# Patient Record
Sex: Female | Born: 2000
Health system: Southern US, Community
[De-identification: ages and names within clinical notes are randomized; demographics above are authoritative.]

## PROBLEM LIST (undated history)

## (undated) DIAGNOSIS — F259 Schizoaffective disorder, unspecified: Secondary | ICD-10-CM

## (undated) DIAGNOSIS — F329 Major depressive disorder, single episode, unspecified: Secondary | ICD-10-CM

## (undated) DIAGNOSIS — J45909 Unspecified asthma, uncomplicated: Secondary | ICD-10-CM

## (undated) DIAGNOSIS — F32A Depression, unspecified: Secondary | ICD-10-CM

## (undated) DIAGNOSIS — F419 Anxiety disorder, unspecified: Secondary | ICD-10-CM

## (undated) DIAGNOSIS — F909 Attention-deficit hyperactivity disorder, unspecified type: Secondary | ICD-10-CM

## (undated) DIAGNOSIS — F840 Autistic disorder: Secondary | ICD-10-CM

## (undated) HISTORY — DX: Attention-deficit hyperactivity disorder, unspecified type: F90.9

## (undated) HISTORY — DX: Schizoaffective disorder, unspecified: F25.9

---
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
## 2018-11-07 ENCOUNTER — Emergency Department (HOSPITAL_COMMUNITY)
Admission: EM | Admit: 2018-11-07 | Discharge: 2018-11-07 | Disposition: A | Discharge status: Home / Self Care | Payer: Medicare Other | Attending: Emergency Medicine | Admitting: Emergency Medicine

## 2018-11-07 ENCOUNTER — Other Ambulatory Visit: Payer: Self-pay

## 2018-11-07 ENCOUNTER — Encounter (HOSPITAL_COMMUNITY): Payer: Self-pay | Admitting: Emergency Medicine

## 2018-11-07 ENCOUNTER — Emergency Department (HOSPITAL_COMMUNITY): Payer: Medicare Other

## 2018-11-07 DIAGNOSIS — J45909 Unspecified asthma, uncomplicated: Secondary | ICD-10-CM | POA: Diagnosis not present

## 2018-11-07 DIAGNOSIS — M25562 Pain in left knee: Secondary | ICD-10-CM | POA: Diagnosis not present

## 2018-11-07 DIAGNOSIS — F840 Autistic disorder: Secondary | ICD-10-CM | POA: Diagnosis not present

## 2018-11-07 DIAGNOSIS — Z79899 Other long term (current) drug therapy: Secondary | ICD-10-CM | POA: Diagnosis not present

## 2018-11-07 DIAGNOSIS — Z888 Allergy status to other drugs, medicaments and biological substances status: Secondary | ICD-10-CM | POA: Diagnosis not present

## 2018-11-07 DIAGNOSIS — Z91040 Latex allergy status: Secondary | ICD-10-CM | POA: Diagnosis not present

## 2018-11-07 DIAGNOSIS — M25561 Pain in right knee: Secondary | ICD-10-CM | POA: Diagnosis not present

## 2018-11-07 MED ORDER — ACETAMINOPHEN 500 MG PO TABS
1000.0000 mg | ORAL_TABLET | Freq: Once | ORAL | Status: AC
  Administered 2018-11-07: 1000 mg via ORAL
  Filled 2018-11-07: qty 2
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
## 2019-01-03 ENCOUNTER — Other Ambulatory Visit: Payer: Self-pay

## 2019-01-03 DIAGNOSIS — Z20822 Contact with and (suspected) exposure to covid-19: Secondary | ICD-10-CM

## 2019-01-06 LAB — NOVEL CORONAVIRUS, NAA: SARS-CoV-2, NAA: NOT DETECTED

## 2019-04-30 ENCOUNTER — Ambulatory Visit: Payer: Medicare Other | Attending: Family

## 2019-04-30 DIAGNOSIS — Z23 Encounter for immunization: Secondary | ICD-10-CM

## 2019-04-30 NOTE — Progress Notes (Signed)
   Covid-19 Vaccination Clinic  Name:  Jessica Vargas    MRN: [id number] DOB: 08-Jul-2000  04/30/2019  Jessica Vargas was observed post Covid-19 immunization for 15 minutes without incident. She was provided with Vaccine Information Sheet and instruction to access the V-Safe system.   Jessica Vargas was instructed to call 911 with any severe reactions post vaccine: Marland Kitchen Difficulty breathing  . Swelling of face and throat  . A fast heartbeat  . A bad rash all over body  . Dizziness and weakness   Immunizations Administered    Name Date Dose VIS Date Route   Moderna COVID-19 Vaccine 04/30/2019  1:35 PM 0.5 mL 12/30/2018 Intramuscular   Manufacturer: Moderna   Lot: [id number]   NDC: 02669-167-56

## 2019-06-02 ENCOUNTER — Ambulatory Visit: Payer: Medicare Other | Attending: Family

## 2019-06-02 DIAGNOSIS — Z23 Encounter for immunization: Secondary | ICD-10-CM

## 2019-06-02 NOTE — Progress Notes (Signed)
   Covid-19 Vaccination Clinic  Name:  Jessica Vargas    MRN: [id number] DOB: July 16, 2000  06/02/2019  [name] was observed post Covid-19 immunization for 15 minutes without incident. She was provided with Vaccine Information Sheet and instruction to access the V-Safe system.   [name] was instructed to call 911 with any severe reactions post vaccine: Marland Kitchen Difficulty breathing  . Swelling of face and throat  . A fast heartbeat  . A bad rash all over body  . Dizziness and weakness   Immunizations Administered    Name Date Dose VIS Date Route   Moderna COVID-19 Vaccine 06/02/2019  2:01 PM 0.5 mL 12/2018 Intramuscular   Manufacturer: Moderna   Lot: [id number]   NDC: 56720-919-80
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
## 2020-01-06 ENCOUNTER — Ambulatory Visit (INDEPENDENT_AMBULATORY_CARE_PROVIDER_SITE_OTHER): Payer: Medicare Other | Admitting: Obstetrics

## 2020-01-06 ENCOUNTER — Other Ambulatory Visit (HOSPITAL_COMMUNITY)
Admission: RE | Admit: 2020-01-06 | Discharge: 2020-01-06 | Disposition: A | Discharge status: Home / Self Care | Payer: Medicare Other | Source: Ambulatory Visit | Attending: Obstetrics | Admitting: Obstetrics

## 2020-01-06 ENCOUNTER — Encounter: Payer: Self-pay | Admitting: Obstetrics

## 2020-01-06 ENCOUNTER — Other Ambulatory Visit: Payer: Self-pay

## 2020-01-06 VITALS — BP 110/69 | HR 79 | Ht 66.0 in | Wt 185.0 lb

## 2020-01-06 DIAGNOSIS — N898 Other specified noninflammatory disorders of vagina: Secondary | ICD-10-CM

## 2020-01-06 DIAGNOSIS — N939 Abnormal uterine and vaginal bleeding, unspecified: Secondary | ICD-10-CM

## 2020-01-06 DIAGNOSIS — R102 Pelvic and perineal pain: Secondary | ICD-10-CM

## 2020-01-06 DIAGNOSIS — Z01419 Encounter for gynecological examination (general) (routine) without abnormal findings: Secondary | ICD-10-CM | POA: Diagnosis not present

## 2020-01-06 DIAGNOSIS — Z30431 Encounter for routine checking of intrauterine contraceptive device: Secondary | ICD-10-CM

## 2020-01-06 DIAGNOSIS — N946 Dysmenorrhea, unspecified: Secondary | ICD-10-CM

## 2020-01-06 MED ORDER — IBUPROFEN 800 MG PO TABS: 800.0000 mg | ORAL_TABLET | Freq: Three times a day (TID) | ORAL | 5 refills | Status: DC | PRN

## 2020-01-06 NOTE — Progress Notes (Signed)
Pt states she is having heavy, frequent periods, bleeding with intercourse, hot flashes and pelvic pain.  Pt has IUD-Mirena in place x 4 years.
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
## 2020-01-07 ENCOUNTER — Other Ambulatory Visit: Payer: Self-pay | Admitting: Obstetrics

## 2020-01-07 DIAGNOSIS — A749 Chlamydial infection, unspecified: Secondary | ICD-10-CM

## 2020-01-07 DIAGNOSIS — N730 Acute parametritis and pelvic cellulitis: Secondary | ICD-10-CM

## 2020-01-07 DIAGNOSIS — B9689 Other specified bacterial agents as the cause of diseases classified elsewhere: Secondary | ICD-10-CM

## 2020-01-07 LAB — CERVICOVAGINAL ANCILLARY ONLY
Bacterial Vaginitis (gardnerella): POSITIVE — AB
Candida Glabrata: NEGATIVE
Candida Vaginitis: NEGATIVE
Chlamydia: POSITIVE — AB
Comment: NEGATIVE
Comment: NEGATIVE
Comment: NEGATIVE
Comment: NEGATIVE
Comment: NEGATIVE
Comment: NORMAL
Neisseria Gonorrhea: NEGATIVE
Trichomonas: NEGATIVE

## 2020-01-07 MED ORDER — DOXYCYCLINE HYCLATE 100 MG PO CAPS: 100.0000 mg | ORAL_CAPSULE | Freq: Two times a day (BID) | ORAL | 0 refills | Status: DC

## 2020-01-07 MED ORDER — METRONIDAZOLE 500 MG PO TABS: 500.0000 mg | ORAL_TABLET | Freq: Two times a day (BID) | ORAL | 0 refills | Status: DC

## 2020-01-07 NOTE — Addendum Note (Signed)
Addended by: Coral Ceo A on: 01/07/2020 01:40 PM   Modules accepted: Orders

## 2020-01-08 LAB — CYTOLOGY - PAP
Diagnosis: NEGATIVE
Diagnosis: REACTIVE

## 2020-01-20 ENCOUNTER — Inpatient Hospital Stay: Admission: RE | Admit: 2020-01-20 | Payer: Medicare Other | Source: Ambulatory Visit

## 2020-01-21 ENCOUNTER — Ambulatory Visit: Payer: Medicare Other | Admitting: Obstetrics

## 2020-01-26 ENCOUNTER — Ambulatory Visit: Payer: Medicare Other

## 2020-01-28 ENCOUNTER — Other Ambulatory Visit: Payer: Self-pay

## 2020-01-28 ENCOUNTER — Ambulatory Visit (HOSPITAL_COMMUNITY)
Admission: RE | Admit: 2020-01-28 | Discharge: 2020-01-28 | Disposition: A | Discharge status: Home / Self Care | Payer: Medicare Other | Source: Ambulatory Visit | Attending: Obstetrics | Admitting: Obstetrics

## 2020-01-28 DIAGNOSIS — R102 Pelvic and perineal pain: Secondary | ICD-10-CM | POA: Diagnosis not present

## 2020-02-01 ENCOUNTER — Ambulatory Visit: Payer: Medicare Other | Admitting: Obstetrics

## 2020-02-04 ENCOUNTER — Other Ambulatory Visit: Payer: Self-pay

## 2020-02-04 ENCOUNTER — Ambulatory Visit (INDEPENDENT_AMBULATORY_CARE_PROVIDER_SITE_OTHER): Payer: Medicare Other | Admitting: Obstetrics

## 2020-02-04 ENCOUNTER — Encounter: Payer: Self-pay | Admitting: Obstetrics

## 2020-02-04 ENCOUNTER — Other Ambulatory Visit (HOSPITAL_COMMUNITY)
Admission: RE | Admit: 2020-02-04 | Discharge: 2020-02-04 | Disposition: A | Discharge status: Home / Self Care | Payer: Medicare Other | Source: Ambulatory Visit | Attending: Obstetrics | Admitting: Obstetrics

## 2020-02-04 VITALS — BP 126/76 | HR 93 | Wt 178.0 lb

## 2020-02-04 DIAGNOSIS — Z0000 Encounter for general adult medical examination without abnormal findings: Secondary | ICD-10-CM | POA: Diagnosis not present

## 2020-02-04 DIAGNOSIS — B373 Candidiasis of vulva and vagina: Secondary | ICD-10-CM | POA: Diagnosis not present

## 2020-02-04 DIAGNOSIS — A749 Chlamydial infection, unspecified: Secondary | ICD-10-CM | POA: Insufficient documentation

## 2020-02-04 DIAGNOSIS — Z23 Encounter for immunization: Secondary | ICD-10-CM

## 2020-02-04 DIAGNOSIS — B3731 Acute candidiasis of vulva and vagina: Secondary | ICD-10-CM

## 2020-02-04 MED ORDER — FLUCONAZOLE 150 MG PO TABS: 150.0000 mg | ORAL_TABLET | Freq: Once | ORAL | 0 refills | Status: DC

## 2020-02-04 NOTE — Progress Notes (Signed)
Pt presents for TOC CT Pt completed ABx Partner has meds but she's unsure if he took it

## 2020-02-04 NOTE — Progress Notes (Signed)
Patient ID: Jessica Vargas, female   DOB: 2000-12-25, 20 y.o.   MRN: [id number]  Chief Complaint  Patient presents with  . Exposure to STD    HPI Jessica Vargas is a 20 y.o. female.  History of chlamydia infection, treated.  Presents today fo TOC.  Complains of vaginal irritation. HPI  Past Medical History:  Diagnosis Date  . ADHD   . Anxiety   . Asthma   . Autism   . Depression   . Schizoaffective disorder (HCC)     History reviewed. No pertinent surgical history.  History reviewed. No pertinent family history.  Social History Social History   Tobacco Use  . Smoking status: Never Smoker  . Smokeless tobacco: Never Used  Substance Use Topics  . Alcohol use: Yes    Alcohol/week: 0.0 standard drinks    Comment: occ  . Drug use: No    Allergies  Allergen Reactions  . Latex Itching  . Risperidone Other (See Comments)    Neurologic    Current Outpatient Medications  Medication Sig Dispense Refill  . albuterol (PROVENTIL HFA;VENTOLIN HFA) 108 (90 Base) MCG/ACT inhaler Inhale 2 puffs into the lungs every 4 (four) hours as needed for wheezing or shortness of breath. 1 Inhaler 0  . fluconazole (DIFLUCAN) 150 MG tablet Take 1 tablet (150 mg total) by mouth once for 1 dose. 1 tablet 0  . ibuprofen (ADVIL) 800 MG tablet Take 1 tablet (800 mg total) by mouth every 8 (eight) hours as needed. 30 tablet 5  . doxycycline (VIBRAMYCIN) 100 MG capsule Take 1 capsule (100 mg total) by mouth 2 (two) times daily. (Patient not taking: Reported on 02/04/2020) 28 capsule 0  . metroNIDAZOLE (FLAGYL) 500 MG tablet Take 1 tablet (500 mg total) by mouth 2 (two) times daily. (Patient not taking: Reported on 02/04/2020) 28 tablet 0  . naproxen (NAPROSYN) 500 MG tablet Take 1 tablet (500 mg total) by mouth 2 (two) times daily. (Patient not taking: Reported on 02/04/2020) 30 tablet 0   No current facility-administered medications for this visit.    Review of Systems Review of Systems Constitutional:  negative for fatigue and weight loss Respiratory: negative for cough and wheezing Cardiovascular: negative for chest pain, fatigue and palpitations Gastrointestinal: negative for abdominal pain and change in bowel habits Genitourinary:positive for vaginal discharge and irritation Integument/breast: negative for nipple discharge Musculoskeletal:negative for myalgias Neurological: negative for gait problems and tremors Behavioral/Psych: negative for abusive relationship, depression Endocrine: negative for temperature intolerance      Blood pressure 126/76, pulse 93, weight 178 lb (80.7 kg), last menstrual period 01/16/2020.  Physical Exam Physical Exam           General:  Alert and no distress Abdomen:  normal findings: no organomegaly, soft, non-tender and no hernia  Pelvis:  External genitalia: normal general appearance Urinary system: urethral meatus normal and bladder without fullness, nontender Vaginal: normal without tenderness, induration or masses Cervix: normal appearance Adnexa: normal bimanual exam Uterus: anteverted and non-tender, normal size    50% of 15 min visit spent on counseling and coordination of care.   Data Reviewed Cultures Wet Prep  Assessment     1. Chlamydia infection Rx: - Cervicovaginal ancillary only  2. Candida vaginitis Rx: - fluconazole (DIFLUCAN) 150 MG tablet; Take 1 tablet (150 mg total) by mouth once for 1 dose.  Dispense: 1 tablet; Refill: 0  3. Routine adult health maintenance Rx: - Flu Vaccine QUAD 36+ mos IM (Fluarix, Quad PF)  Plan   Follow up prn  Orders Placed This Encounter  Procedures  . Flu Vaccine QUAD 36+ mos IM (Fluarix, Quad PF)    Meds ordered this encounter  Medications  . fluconazole (DIFLUCAN) 150 MG tablet    Sig: Take 1 tablet (150 mg total) by mouth once for 1 dose.    Dispense:  1 tablet    Refill:  0     Brock Bad, MD 02/04/2020 11:26 AM

## 2020-02-05 ENCOUNTER — Other Ambulatory Visit: Payer: Self-pay | Admitting: Obstetrics

## 2020-02-05 DIAGNOSIS — B373 Candidiasis of vulva and vagina: Secondary | ICD-10-CM

## 2020-02-05 DIAGNOSIS — B3731 Acute candidiasis of vulva and vagina: Secondary | ICD-10-CM

## 2020-02-05 LAB — CERVICOVAGINAL ANCILLARY ONLY
Bacterial Vaginitis (gardnerella): NEGATIVE
Candida Glabrata: NEGATIVE
Candida Vaginitis: POSITIVE — AB
Chlamydia: NEGATIVE
Comment: NEGATIVE
Comment: NEGATIVE
Comment: NEGATIVE
Comment: NEGATIVE
Comment: NEGATIVE
Comment: NORMAL
Neisseria Gonorrhea: NEGATIVE
Trichomonas: NEGATIVE

## 2020-02-05 MED ORDER — FLUCONAZOLE 150 MG PO TABS: 150.0000 mg | ORAL_TABLET | Freq: Once | ORAL | 0 refills | Status: AC

## 2020-02-08 ENCOUNTER — Telehealth: Payer: Self-pay

## 2020-02-08 NOTE — Telephone Encounter (Signed)
-----   Message from Brock Bad, MD sent at 02/05/2020 11:54 AM EST ----- Diflucan Rx for yeast

## 2020-02-08 NOTE — Telephone Encounter (Signed)
Call patient no answer or voice mail

## 2020-04-26 ENCOUNTER — Ambulatory Visit (INDEPENDENT_AMBULATORY_CARE_PROVIDER_SITE_OTHER): Payer: Medicare Other | Admitting: Obstetrics

## 2020-04-26 ENCOUNTER — Encounter: Payer: Self-pay | Admitting: Obstetrics

## 2020-04-26 ENCOUNTER — Other Ambulatory Visit: Payer: Self-pay

## 2020-04-26 VITALS — BP 125/71 | HR 90 | Ht 66.0 in | Wt 199.0 lb

## 2020-04-26 DIAGNOSIS — Z30433 Encounter for removal and reinsertion of intrauterine contraceptive device: Secondary | ICD-10-CM | POA: Diagnosis not present

## 2020-04-26 DIAGNOSIS — Z975 Presence of (intrauterine) contraceptive device: Secondary | ICD-10-CM

## 2020-04-26 MED ORDER — LEVONORGESTREL 20 MCG/24HR IU IUD
INTRAUTERINE_SYSTEM | Freq: Once | INTRAUTERINE | Status: AC
  Administered 2020-04-26: 52 mg via INTRAUTERINE

## 2020-04-26 NOTE — Progress Notes (Addendum)
    GYNECOLOGY OFFICE PROCEDURE NOTE  Jessica Vargas is a 20 y.o. G0P0000 here for Mirena IUD removal and reinsertion. No GYN concerns.  Last pap smear was on 04-26-2020 and is normal.  IUD Removal and Reinsertion  Patient identified, informed consent performed, consent signed.   Discussed risks of irregular bleeding, cramping, infection, malpositioning or misplacement of the IUD outside the uterus which may require further procedures. Also discussed >99% contraception efficacy, increased risk of ectopic pregnancy with failure of method.   Emphasized that this did not protect against STIs, condoms recommended during all sexual encounters.Advised to use backup contraception for one week as the risk of pregnancy is higher during the transition period of removing an IUD and replacing it with another one. Time out was performed. Speculum placed in the vagina. The strings of the IUD were grasped and pulled using ring forceps. The IUD was successfully removed in its entirety. The cervix was cleaned with Betadine x 2 and grasped anteriorly with a single tooth tenaculum.  The new Mirena IUD insertion apparatus was used to sound the uterus to 6 cm;  the IUD was then placed per manufacturer's recommendations. Strings trimmed to 3 cm. Tenaculum was removed, good hemostasis noted. Patient tolerated procedure well.   IUD:  MIRENA Lot #:  [id number] Exp Date:  2024 JUN  Patient was given post-procedure instructions.  She was reminded to have backup contraception for one week during this transition period between IUDs.  Patient was also asked to check IUD strings periodically and follow up in 4 weeks for IUD check.   Brock Bad, MD, FACOG Obstetrician & Gynecologist, [facility] for [facility], [facility] 04/26/20

## 2020-04-26 NOTE — Progress Notes (Addendum)
GYN presents to discuss [facility] and IUD removal and Insertion.  Last PAP 01/06/2020  Administrations This Visit    levonorgestrel (MIRENA) 20 MCG/24HR IUD    Admin Date 04/26/2020 Action Given Dose 52 mg Route Intrauterine Administered By Maretta Bees, RMA         Good for 6 years until 04/27/2026

## 2020-05-09 ENCOUNTER — Ambulatory Visit
Admission: RE | Admit: 2020-05-09 | Discharge: 2020-05-09 | Disposition: A | Discharge status: Home / Self Care | Payer: Medicare Other | Source: Ambulatory Visit | Attending: Obstetrics | Admitting: Obstetrics

## 2020-05-09 ENCOUNTER — Other Ambulatory Visit: Payer: Self-pay

## 2020-05-09 DIAGNOSIS — Z975 Presence of (intrauterine) contraceptive device: Secondary | ICD-10-CM | POA: Diagnosis not present

## 2020-05-12 ENCOUNTER — Telehealth (INDEPENDENT_AMBULATORY_CARE_PROVIDER_SITE_OTHER): Payer: Medicare Other | Admitting: Obstetrics

## 2020-05-12 ENCOUNTER — Encounter: Payer: Self-pay | Admitting: Obstetrics

## 2020-05-12 DIAGNOSIS — Z712 Person consulting for explanation of examination or test findings: Secondary | ICD-10-CM

## 2020-05-12 DIAGNOSIS — Z975 Presence of (intrauterine) contraceptive device: Secondary | ICD-10-CM | POA: Diagnosis not present

## 2020-05-12 NOTE — Progress Notes (Signed)
GYNECOLOGY VIRTUAL VISIT ENCOUNTER NOTE  Provider location: Center for [facility] at [facility]   Patient location: Home  I connected with Jessica Vargas on 05/12/20 at 10:45 AM EDT by MyChart Video Encounter and verified that I am speaking with the correct person using two identifiers.   I discussed the limitations, risks, security and privacy concerns of performing an evaluation and management service virtually and the availability of in person appointments. I also discussed with the patient that there may be a patient responsible charge related to this service. The patient expressed understanding and agreed to proceed.   History:  Jessica Vargas is a 20 y.o. G0P0000 female being evaluated today for ultrasound results for IUD position. She denies any abnormal vaginal discharge, bleeding, pelvic pain or other concerns.       Past Medical History:  Diagnosis Date  . ADHD   . Anxiety   . Asthma   . Autism   . Depression   . Schizoaffective disorder (HCC)    No past surgical history on file. The following portions of the patient's history were reviewed and updated as appropriate: allergies, current medications, past family history, past medical history, past social history, past surgical history and problem list.   Health Maintenance:  Normal pap and negative HRHPV on 01-06-2020.    Review of Systems:  Pertinent items noted in HPI and remainder of comprehensive ROS otherwise negative.  Physical Exam:   General:  Alert, oriented and cooperative. Patient appears to be in no acute distress.  Mental Status: Normal mood and affect. Normal behavior. Normal judgment and thought content.   Respiratory: Normal respiratory effort, no problems with respiration noted  Rest of physical exam deferred due to type of encounter  Labs and Imaging No results found for this or any previous visit (from the past 336 hour(s)). US PELVIC COMPLETE WITH TRANSVAGINAL  Result Date:  05/09/2020 CLINICAL DATA:  IUD placed 04/26/2020, assess position, LMP 04/22/2020 EXAM: TRANSABDOMINAL AND TRANSVAGINAL ULTRASOUND OF PELVIS TECHNIQUE: Both transabdominal and transvaginal ultrasound examinations of the pelvis were performed. Transabdominal technique was performed for global imaging of the pelvis including uterus, ovaries, adnexal regions, and pelvic cul-de-sac. It was necessary to proceed with endovaginal exam following the transabdominal exam to visualize the endometrium and ovaries. COMPARISON:  01/28/2020 FINDINGS: Uterus Measurements: 8.0 x 3.7 x 4.4 cm = volume: 67 mL. Heterogeneous myometrium. Few scattered areas of shadowing. No focal uterine mass. Endometrium Thickness: 6 mm. No endometrial fluid or mass. IUD in expected position at upper uterine segment of endometrial canal. Right ovary Measurements: 4.0 x 2.9 x 2.7 cm = volume: 16.4 mL. Numerous peripheral follicles without dominant mass Left ovary Measurements: 4.0 x 2.4 x 2.2 cm = volume: 10.9 mL. Numerous peripheral follicles without dominant mass Other findings Small amount of nonspecific free pelvic fluid.  No adnexal masses. IMPRESSION: IUD in expected position at upper uterine segment endometrial canal. Numerous peripheral follicles throughout both ovaries, nonspecific but can be seen in the setting of polycystic ovarian syndrome. Electronically Signed   By: Ulyses Southward M.D.   On: 05/09/2020 11:00       Assessment and Plan:     1. IUD (intrauterine device) in place - normal intrauterine position in upper uterine segment - patient doing well - follow up in 4 weeks for IUD string check     I discussed the assessment and treatment plan with the patient. The patient was provided an opportunity to ask questions and all  were answered. The patient agreed with the plan and demonstrated an understanding of the instructions.   The patient was advised to call back or seek an in-person evaluation/go to the ED if the symptoms  worsen or if the condition fails to improve as anticipated.  I provided 10 minutes of face-to-face time during this encounter.   Coral Ceo, MD Center for [facility], [facility] 05/12/20

## 2020-09-13 ENCOUNTER — Other Ambulatory Visit (HOSPITAL_COMMUNITY)
Admission: RE | Admit: 2020-09-13 | Discharge: 2020-09-13 | Disposition: A | Discharge status: Home / Self Care | Payer: Medicare Other | Source: Ambulatory Visit | Attending: Obstetrics | Admitting: Obstetrics

## 2020-09-13 ENCOUNTER — Encounter: Payer: Self-pay | Admitting: Obstetrics

## 2020-09-13 ENCOUNTER — Other Ambulatory Visit: Payer: Self-pay

## 2020-09-13 ENCOUNTER — Ambulatory Visit (INDEPENDENT_AMBULATORY_CARE_PROVIDER_SITE_OTHER): Payer: Medicare Other | Admitting: Obstetrics

## 2020-09-13 VITALS — BP 117/70 | HR 85 | Ht 66.0 in | Wt 208.8 lb

## 2020-09-13 DIAGNOSIS — Z975 Presence of (intrauterine) contraceptive device: Secondary | ICD-10-CM | POA: Diagnosis not present

## 2020-09-13 DIAGNOSIS — N898 Other specified noninflammatory disorders of vagina: Secondary | ICD-10-CM | POA: Insufficient documentation

## 2020-09-13 DIAGNOSIS — R102 Pelvic and perineal pain: Secondary | ICD-10-CM

## 2020-09-13 NOTE — Progress Notes (Addendum)
Subjective:    Jessica Vargas is a 20 y.o. female who presents for IUD Surveillance. The patient has had LLQ pelvic pain and vaginal discharge for the past 2 weeks. The patient is sexually active. Pertinent past medical history: none.  The information documented in the HPI was reviewed and verified.  Menstrual History: OB History     Gravida  0   Para  0   Term  0   Preterm  0   AB  0   Living  0      SAB  0   IAB  0   Ectopic  0   Multiple  0   Live Births  0            Patient's last menstrual period was 09/01/2020.   Patient Active Problem List   Diagnosis Date Noted   Vasovagal syncope 06/02/2015   Vasovagal near syncope 06/02/2015   Chronic tension-type headache, not intractable 06/02/2015   Autism spectrum disorder requiring support (level 1) 06/02/2015   Past Medical History:  Diagnosis Date   ADHD    Anxiety    Asthma    Autism    Depression    Schizoaffective disorder (HCC)     History reviewed. No pertinent surgical history.   Current Outpatient Medications:    albuterol (PROVENTIL HFA;VENTOLIN HFA) 108 (90 Base) MCG/ACT inhaler, Inhale 2 puffs into the lungs every 4 (four) hours as needed for wheezing or shortness of breath., Disp: 1 Inhaler, Rfl: 0   diphenhydrAMINE (BENADRYL) 25 mg capsule, Take by mouth., Disp: , Rfl:    ibuprofen (ADVIL) 800 MG tablet, Take 1 tablet (800 mg total) by mouth every 8 (eight) hours as needed., Disp: 30 tablet, Rfl: 5 Allergies  Allergen Reactions   Latex Itching   Risperidone Other (See Comments)    Neurologic    Social History   Tobacco Use   Smoking status: Never   Smokeless tobacco: Never  Substance Use Topics   Alcohol use: Yes    Alcohol/week: 0.0 standard drinks    Comment: occ    History reviewed. No pertinent family history.     Review of Systems Constitutional: negative for weight loss Genitourinary:negative for abnormal menstrual periods and vaginal discharge   Objective:    BP 117/70   Pulse 85   Ht 5\' 6"  (1.676 m)   Wt 208 lb 12.8 oz (94.7 kg)   LMP 09/01/2020   BMI 33.70 kg/m    General:   Alert and no distress  Skin:   no rash or abnormalities  Lungs:   clear to auscultation bilaterally  Heart:   regular rate and rhythm, S1, S2 normal, no murmur, click, rub or gallop  Breasts:   Not examined  Abdomen:  normal findings: no organomegaly, soft, non-tender and no hernia  Pelvis:  External genitalia: normal general appearance Urinary system: urethral meatus normal and bladder without fullness, nontender Vaginal: normal without tenderness, induration or masses Cervix: normal appearance.  IUD string visible, normal length Adnexa: normal bimanual exam Uterus: anteverted and non-tender, normal size   Lab Review Urine pregnancy test Labs reviewed yes Radiologic studies reviewed no  I have spent a total of 20 minutes of face-to-face time, excluding clinical staff time, reviewing notes and preparing to see patient, ordering tests and/or medications, and counseling the patient.   Assessment and Plan:    20 y.o., continuing IUD, no contraindications.   1. Pelvic pain Rx: - 21 PELVIC COMPLETE WITH TRANSVAGINAL; Future  2. IUD (intrauterine device) in place Rx: - US PELVIC COMPLETE WITH TRANSVAGINAL; Future  3. Vaginal discharge Rx: - Cervicovaginal ancillary only    Plan:    Abdominal ultrasound. All questions answered. Chlamydia specimen. Follow up in 2 weeks. GC specimen. Urinalysis. Urine culture and sensitivity. Wet prep.   Orders Placed This Encounter  Procedures   US PELVIC COMPLETE WITH TRANSVAGINAL    Standing Status:   Future    Standing Expiration Date:   09/13/2021    Order Specific Question:   Reason for Exam (SYMPTOM  OR DIAGNOSIS REQUIRED)    Answer:   Pelvic pain.  IUD in place.    Order Specific Question:   Preferred imaging location?    Answer:   [facility]    Brock Bad, MD 09/13/2020 12:24 PM

## 2020-09-13 NOTE — Progress Notes (Signed)
Patient presents for IUD check.  IUD has been in since April 2022. She is having lower abdominal pain that has been present for about 2 weeks. She has not tried taking anything for the pain. She wants to make sure that her Iud is still in place

## 2020-09-14 LAB — CERVICOVAGINAL ANCILLARY ONLY
Bacterial Vaginitis (gardnerella): NEGATIVE
Candida Glabrata: NEGATIVE
Candida Vaginitis: NEGATIVE
Chlamydia: NEGATIVE
Comment: NEGATIVE
Comment: NEGATIVE
Comment: NEGATIVE
Comment: NEGATIVE
Comment: NEGATIVE
Comment: NORMAL
Neisseria Gonorrhea: NEGATIVE
Trichomonas: NEGATIVE

## 2020-09-19 ENCOUNTER — Ambulatory Visit (HOSPITAL_BASED_OUTPATIENT_CLINIC_OR_DEPARTMENT_OTHER)
Admission: RE | Admit: 2020-09-19 | Discharge: 2020-09-19 | Disposition: A | Discharge status: Home / Self Care | Payer: Medicare Other | Source: Ambulatory Visit | Attending: Obstetrics | Admitting: Obstetrics

## 2020-09-19 ENCOUNTER — Other Ambulatory Visit: Payer: Self-pay

## 2020-09-19 DIAGNOSIS — Z975 Presence of (intrauterine) contraceptive device: Secondary | ICD-10-CM | POA: Diagnosis present

## 2020-09-19 DIAGNOSIS — R102 Pelvic and perineal pain: Secondary | ICD-10-CM | POA: Diagnosis not present

## 2020-09-27 ENCOUNTER — Telehealth: Payer: Medicare Other | Admitting: Obstetrics

## 2020-09-29 ENCOUNTER — Telehealth: Payer: Medicare Other | Admitting: Obstetrics

## 2020-09-29 ENCOUNTER — Encounter: Payer: Self-pay | Admitting: Obstetrics

## 2020-09-29 NOTE — Progress Notes (Signed)
Patient rescheduled.appointment.  Brock Bad, MD 09/29/2020 4:45 PM

## 2020-10-06 ENCOUNTER — Encounter: Payer: Self-pay | Admitting: Obstetrics

## 2020-10-06 ENCOUNTER — Other Ambulatory Visit: Payer: Self-pay

## 2020-10-06 ENCOUNTER — Ambulatory Visit (INDEPENDENT_AMBULATORY_CARE_PROVIDER_SITE_OTHER): Payer: Medicare Other | Admitting: Obstetrics

## 2020-10-06 VITALS — BP 123/78 | HR 91 | Wt 211.0 lb

## 2020-10-06 DIAGNOSIS — N3021 Other chronic cystitis with hematuria: Secondary | ICD-10-CM

## 2020-10-06 DIAGNOSIS — Z975 Presence of (intrauterine) contraceptive device: Secondary | ICD-10-CM

## 2020-10-06 DIAGNOSIS — R102 Pelvic and perineal pain: Secondary | ICD-10-CM | POA: Diagnosis not present

## 2020-10-06 DIAGNOSIS — N946 Dysmenorrhea, unspecified: Secondary | ICD-10-CM | POA: Diagnosis not present

## 2020-10-06 LAB — POCT URINE QUALITATIVE DIPSTICK BLOOD

## 2020-10-06 MED ORDER — IBUPROFEN 800 MG PO TABS: 800.0000 mg | ORAL_TABLET | Freq: Three times a day (TID) | ORAL | 5 refills | Status: AC | PRN

## 2020-10-06 MED ORDER — CEFUROXIME AXETIL 500 MG PO TABS: 500.0000 mg | ORAL_TABLET | Freq: Two times a day (BID) | ORAL | 0 refills | Status: DC

## 2020-10-06 NOTE — Progress Notes (Addendum)
Patient ID: Jessica Vargas, female   DOB: 22-Aug-2000, 22 y.o.   MRN: [id number]  Chief Complaint  Patient presents with   Follow-up    HPI Jessica Vargas is a 20 y.o. female.   History of pelvic pain.  Presents today for follow up results of ultrasound  for pelvic pain.  Pain is less but not resolved.  HPI  Past Medical History:  Diagnosis Date   ADHD    Anxiety    Asthma    Autism    Depression    Schizoaffective disorder (HCC)     History reviewed. No pertinent surgical history.  History reviewed. No pertinent family history.  Social History Social History   Tobacco Use   Smoking status: Never   Smokeless tobacco: Never  Substance Use Topics   Alcohol use: Yes    Alcohol/week: 0.0 standard drinks    Comment: occ   Drug use: No    Allergies  Allergen Reactions   Latex Itching   Risperidone Other (See Comments)    Neurologic    Current Outpatient Medications  Medication Sig Dispense Refill   albuterol (PROVENTIL HFA;VENTOLIN HFA) 108 (90 Base) MCG/ACT inhaler Inhale 2 puffs into the lungs every 4 (four) hours as needed for wheezing or shortness of breath. 1 Inhaler 0   cefUROXime (CEFTIN) 500 MG tablet Take 1 tablet (500 mg total) by mouth 2 (two) times daily with a meal. 14 tablet 0   diphenhydrAMINE (BENADRYL) 25 mg capsule Take by mouth. (Patient not taking: Reported on 10/06/2020)     ibuprofen (ADVIL) 800 MG tablet Take 1 tablet (800 mg total) by mouth every 8 (eight) hours as needed. 30 tablet 5   No current facility-administered medications for this visit.    Review of Systems Review of Systems Constitutional: negative for fatigue and weight loss Respiratory: negative for cough and wheezing Cardiovascular: negative for chest pain, fatigue and palpitations Gastrointestinal: negative for abdominal pain and change in bowel habits Genitourinary: positive for pelvic pain Integument/breast: negative for nipple discharge Musculoskeletal:negative for  myalgias Neurological: negative for gait problems and tremors Behavioral/Psych: negative for abusive relationship, depression Endocrine: negative for temperature intolerance      Blood pressure 123/78, pulse 91, weight 211 lb (95.7 kg).  Physical Exam Physical Exam General:   Alert and no distress  Skin:   no rash or abnormalities  Lungs:   clear to auscultation bilaterally  Heart:   regular rate and rhythm, S1, S2 normal, no murmur, click, rub or gallop  The remainder of the physical exam deferred due to the type of encounter   I have spent a total of 15 minutes of face-to-face time, excluding clinical staff time, reviewing notes and preparing to see patient, ordering tests and/or medications, and counseling the patient.   Data Reviewed Ultrasound: US PELVIC COMPLETE WITH TRANSVAGINAL (Accession [id number]) (Order [id number]) Imaging Date: 09/19/2020 Department: [facility] Released By: Kathi Simpers Authorizing: Brock Bad, MD   Exam Status  Status  Final [99]   PACS Intelerad Image Link   Show images for US PELVIC COMPLETE WITH TRANSVAGINAL Study Result  Narrative & Impression  CLINICAL DATA:  Pelvic pain for 2 weeks, has IUD, LMP 09/02/2020   EXAM: TRANSABDOMINAL AND TRANSVAGINAL ULTRASOUND OF PELVIS   TECHNIQUE: Both transabdominal and transvaginal ultrasound examinations of the pelvis were performed. Transabdominal technique was performed for global imaging of the pelvis including uterus, ovaries, adnexal regions, and pelvic cul-de-sac. It was necessary to proceed with  endovaginal exam following the transabdominal exam to visualize the endometrium and IUD.   COMPARISON:  05/09/2020   FINDINGS: Uterus   Measurements: 7.3 x 3.4 x 5.5 cm = volume: 72 mL. Anteverted. Normal morphology without mass   Endometrium   Thickness: 4 mm. IUD in expected position at the upper uterine segment endometrial canal. No endometrial fluid  or mass   Right ovary   Measurements: 3.3 x 2.0 x 3.1 cm = volume: 10.9 mL. Normal morphology without mass   Left ovary   Measurements: 3.4 x 2.8 x 3.2 cm = volume: 15.5 mL. Small hemorrhagic corpus luteum. No additional masses.   Other findings   Small amount of nonspecific free pelvic fluid.  No adnexal masses.   IMPRESSION: IUD in expected position at upper uterine segment endometrial canal.   No other significant pelvic sonographic findings.     Electronically Signed   By: Ulyses Southward M.D.   On: 09/19/2020 12:51      Assessment     1. Pelvic pain - ? Etiology.  Gyn work-up negative - will follow clinically  2. Dysmenorrhea Rx: - ibuprofen (ADVIL) 800 MG tablet; Take 1 tablet (800 mg total) by mouth every 8 (eight) hours as needed.  Dispense: 30 tablet; Refill: 5  3. Hematuria due to chronic cystitis Rx: - Urine Culture - POCT urine qual dipstick blood - cefUROXime (CEFTIN) 500 MG tablet; Take 1 tablet (500 mg total) by mouth 2 (two) times daily with a meal.  Dispense: 14 tablet; Refill: 0  4. IUD (intrauterine device) in place - continue IUD     Plan - Ibuprofen prn   Follow up in 3 months  Orders Placed This Encounter  Procedures   Urine Culture    Order Specific Question:   Release to patient    Answer:   Immediate   POCT urine qual dipstick blood   Meds ordered this encounter  Medications   ibuprofen (ADVIL) 800 MG tablet    Sig: Take 1 tablet (800 mg total) by mouth every 8 (eight) hours as needed.    Dispense:  30 tablet    Refill:  5   cefUROXime (CEFTIN) 500 MG tablet    Sig: Take 1 tablet (500 mg total) by mouth 2 (two) times daily with a meal.    Dispense:  14 tablet    Refill:  0      Brock Bad, MD 10/06/2020 2:30 PM

## 2020-10-09 LAB — URINE CULTURE

## 2021-01-03 ENCOUNTER — Other Ambulatory Visit: Payer: Self-pay | Admitting: Obstetrics

## 2021-01-03 DIAGNOSIS — B3731 Acute candidiasis of vulva and vagina: Secondary | ICD-10-CM

## 2021-03-03 IMAGING — US US PELVIS COMPLETE WITH TRANSVAGINAL
1 series · 14 of 25 positions shown · non-contrast
Comparison: None

CLINICAL DATA: Pelvic pain

EXAM:
TRANSABDOMINAL AND TRANSVAGINAL ULTRASOUND OF PELVIS
TECHNIQUE: Both transabdominal and transvaginal ultrasound examinations of the
pelvis were performed. Transabdominal technique was performed for
global imaging of the pelvis including uterus, ovaries, adnexal
regions, and pelvic cul-de-sac. It was necessary to proceed with
endovaginal exam following the transabdominal exam to visualize the
uterus endometrium ovaries.

[Series 1: us pelvic complete with transvaginal · 14 of 77 slices shown]
[im 1/77]
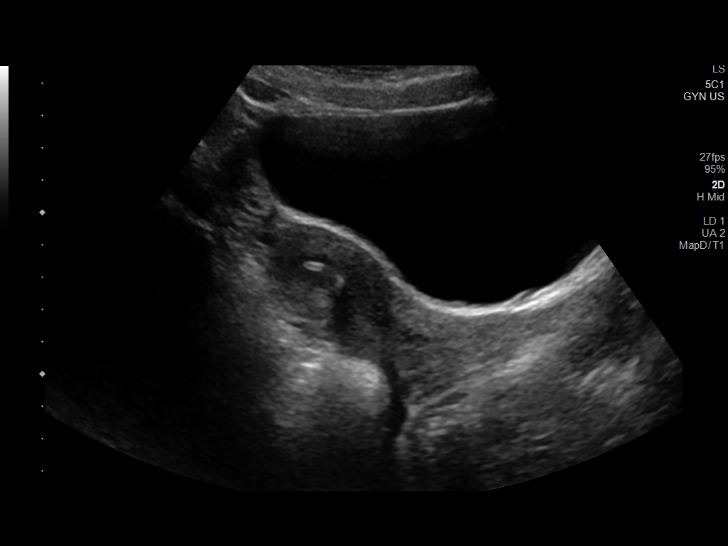
[im 7/77]
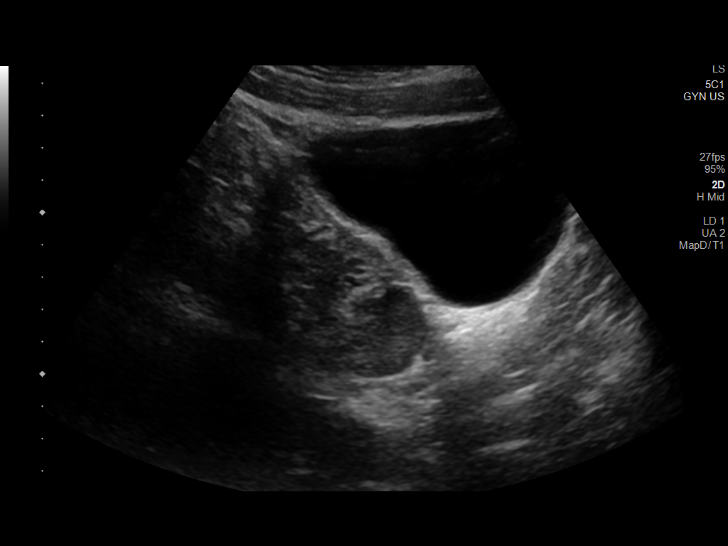
[im 13/77]
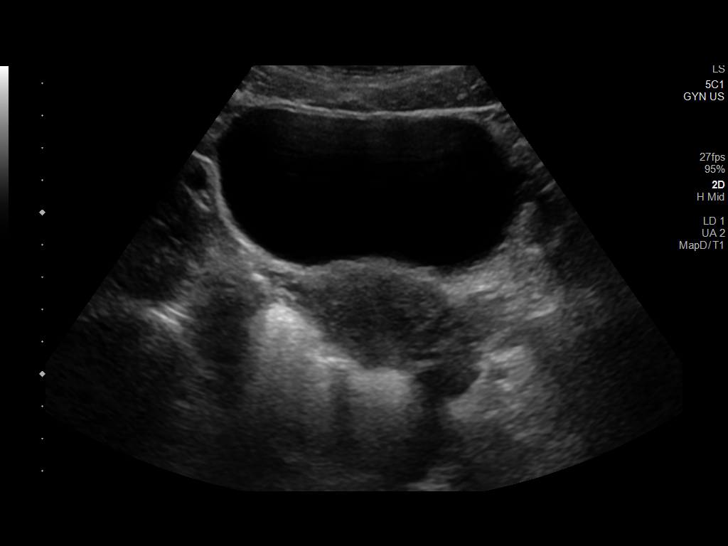
[im 20/77]
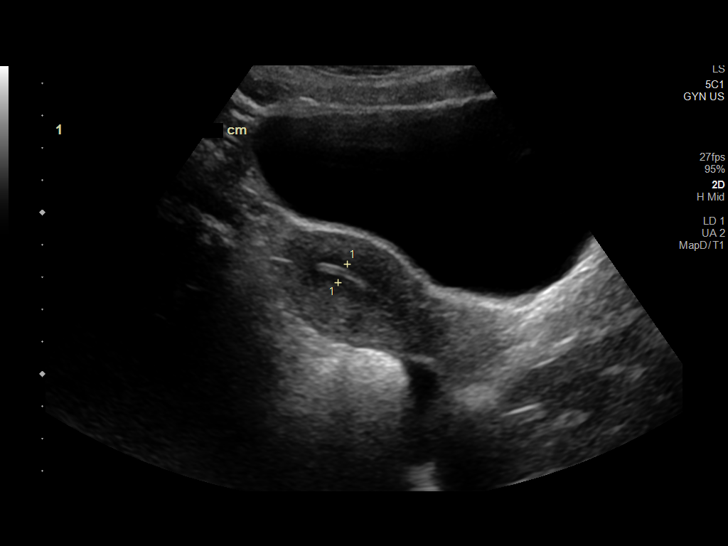
[im 26/77]
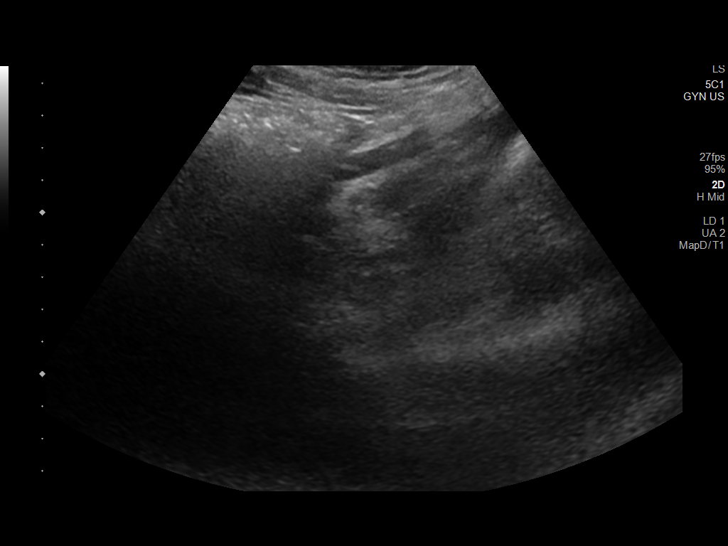
[im 29/77]
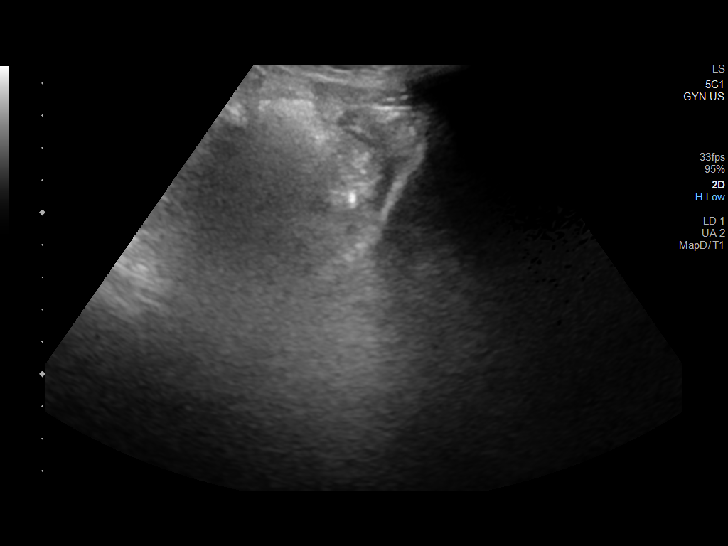
[im 35/77]
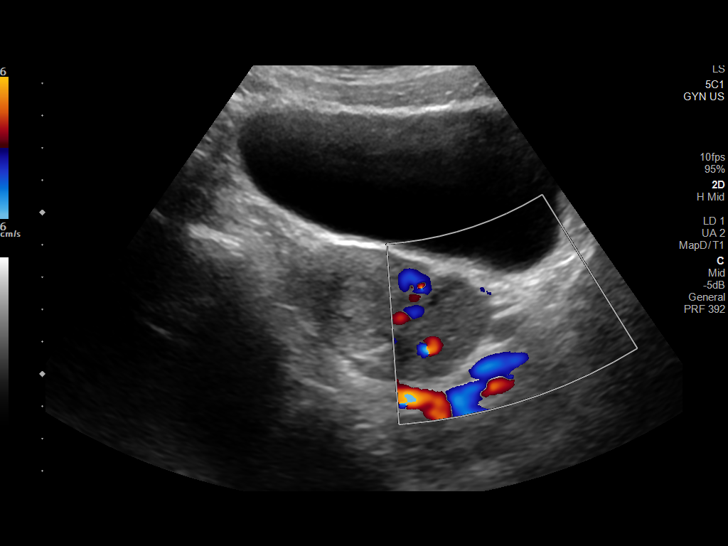
[im 42/77]
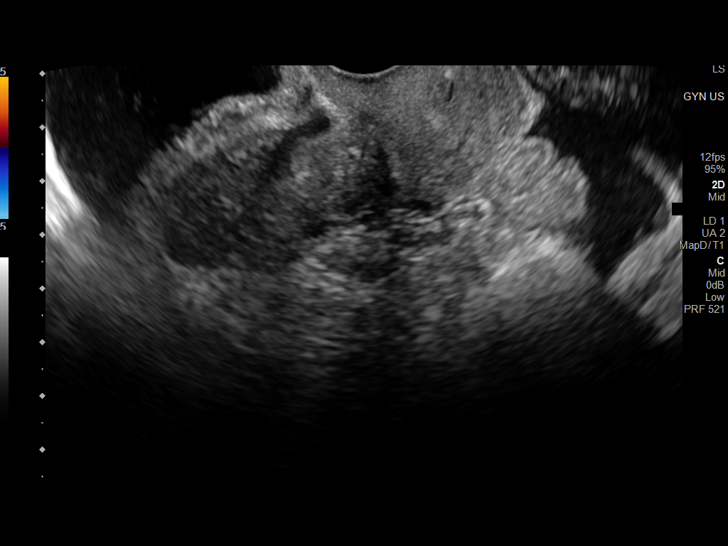
[im 48/77]
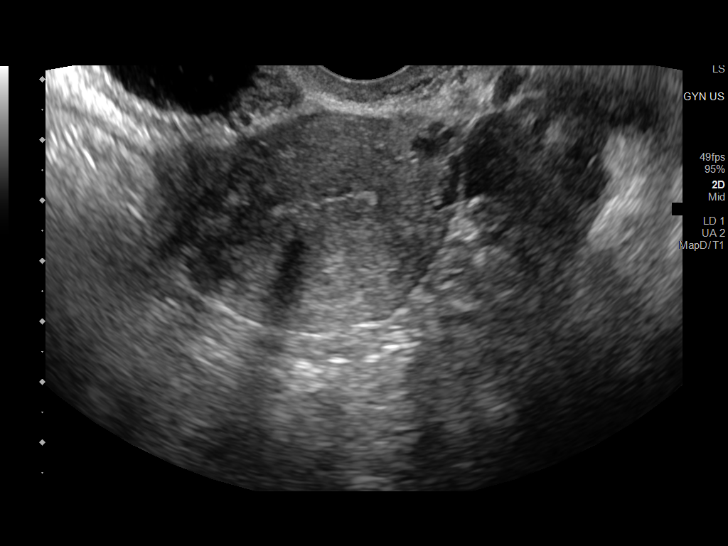
[im 51/77]
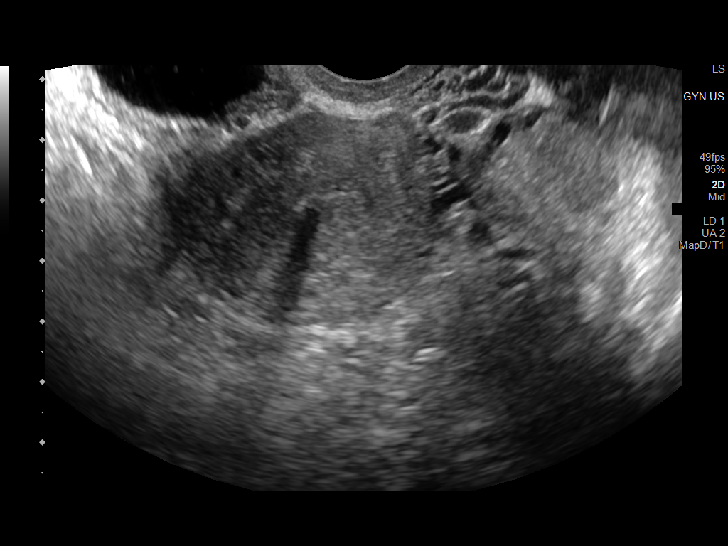
[im 58/77]
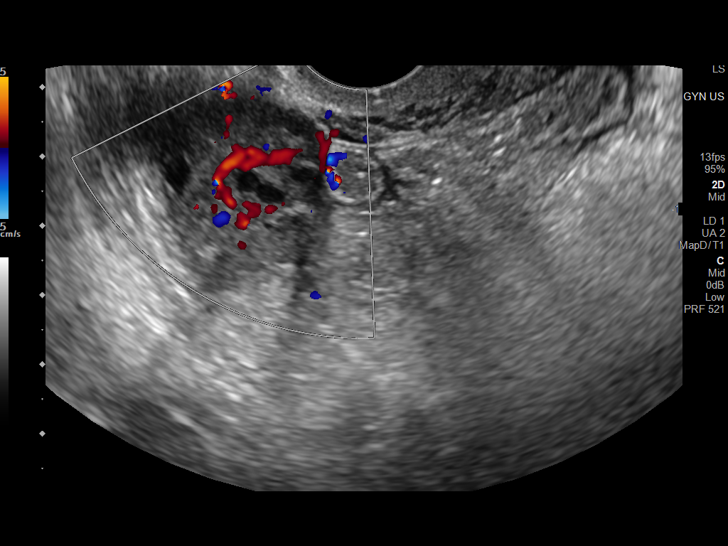
[im 64/77]
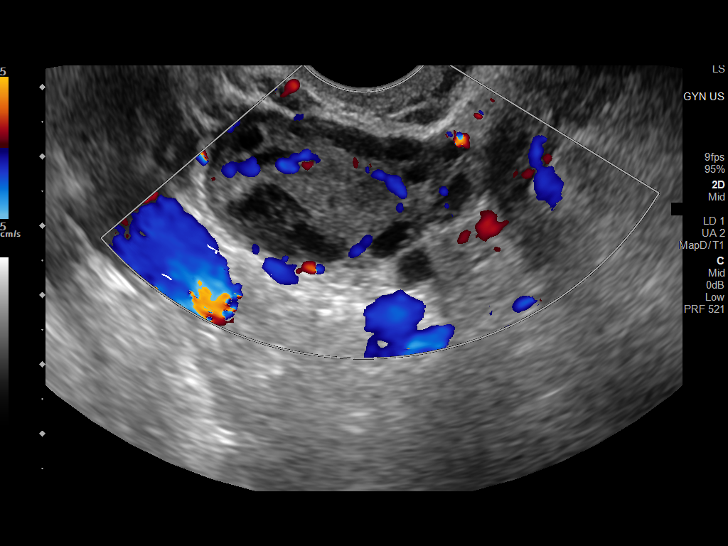
[im 70/77]
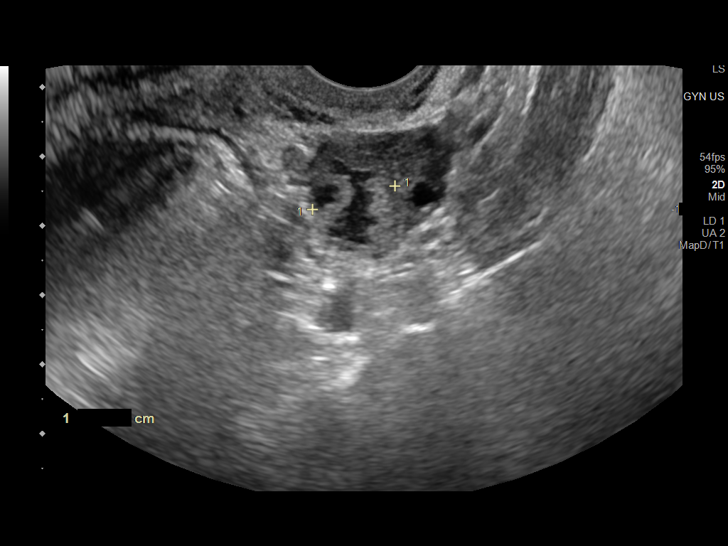
[im 77/77]
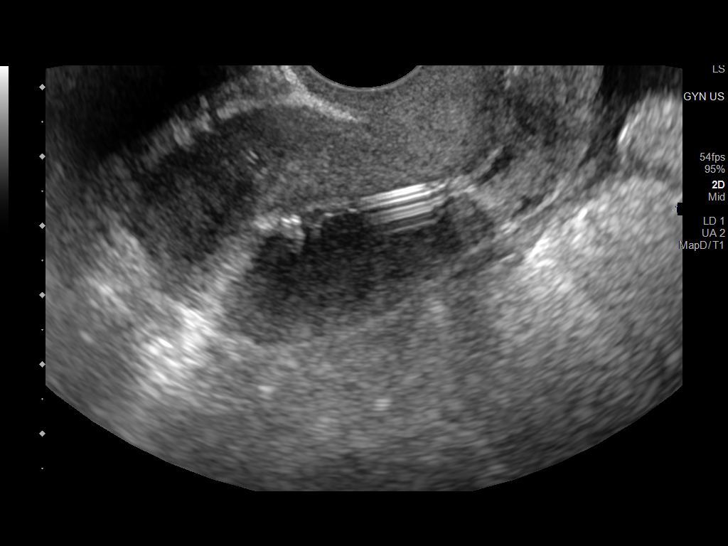

[14 of 25 positions shown; findings below may reference images not displayed]

FINDINGS: Uterus

Measurements: 7.6 x 3.3 x 4.6 cm = volume: 60.7 mL. No fibroids or
other mass visualized.

Endometrium

Thickness: 7.2 mm.  IUD within the uterus

Right ovary

Measurements: 2.9 x 1.5 x 3.1 cm = volume: 6 point mL. Normal
appearance/no adnexal mass.

Left ovary

Measurements: 4.7 x 2.3 x 2.7 cm = volume: 15.1 mL. 2.1 x 1.7 x
cm mildly complex ovarian cyst, likely involuting hemorrhagic cyst,
no further follow-up imaging recommended.

Other findings

Small free fluid
IMPRESSION: 1. IUD appears appropriately positioned within the uterus.
2. Small free fluid in the pelvis.

## 2021-03-14 ENCOUNTER — Encounter: Payer: Self-pay | Admitting: Obstetrics

## 2021-03-14 ENCOUNTER — Telehealth (INDEPENDENT_AMBULATORY_CARE_PROVIDER_SITE_OTHER): Payer: Medicare Other | Admitting: Obstetrics

## 2021-03-14 DIAGNOSIS — Z975 Presence of (intrauterine) contraceptive device: Secondary | ICD-10-CM

## 2021-03-14 NOTE — Progress Notes (Signed)
MyChart visit Meds and allergies reviewed. Wants to discuss birth control

## 2021-03-14 NOTE — Progress Notes (Signed)
° ° °  GYNECOLOGY VIRTUAL VISIT ENCOUNTER NOTE  Provider location: Center for [facility] at [facility]   Patient location: Home  I connected with Jessica Vargas on 03/14/21 at  2:30 PM EST by MyChart Video Encounter and verified that I am speaking with the correct person using two identifiers.   I discussed the limitations, risks, security and privacy concerns of performing an evaluation and management service virtually and the availability of in person appointments. I also discussed with the patient that there may be a patient responsible charge related to this service. The patient expressed understanding and agreed to proceed.   History:  Jessica Vargas is a 21 y.o. G0P0000 female being evaluated today for contraceptive management. She is currently using the Mirena IUD.  She states that her periods are fairly heavy.  She denies any abnormal vaginal discharge, bleeding, pelvic pain or other concerns.       Past Medical History:  Diagnosis Date   ADHD    Anxiety    Asthma    Autism    Depression    Schizoaffective disorder (HCC)    History reviewed. No pertinent surgical history. The following portions of the patient's history were reviewed and updated as appropriate: allergies, current medications, past family history, past medical history, past social history, past surgical history and problem list.   Health Maintenance:  Normal pap and negative HRHPV on 01-06-2020.    Review of Systems:  Pertinent items noted in HPI and remainder of comprehensive ROS otherwise negative.  Physical Exam:   General:  Alert, oriented and cooperative. Patient appears to be in no acute distress.  Mental Status: Normal mood and affect. Normal behavior. Normal judgment and thought content.   Respiratory: Normal respiratory effort, no problems with respiration noted  Rest of physical exam deferred due to type of encounter  Labs and Imaging No results found for this or any previous visit (from the  past 336 hour(s)). No results found.     Assessment and Plan:     1. IUD (intrauterine device) in place - considering other options for contraception because periods are a little heavy - other options discussed, and she decided to continue the Mirena IUD       I discussed the assessment and treatment plan with the patient. The patient was provided an opportunity to ask questions and all were answered. The patient agreed with the plan and demonstrated an understanding of the instructions.   The patient was advised to call back or seek an in-person evaluation/go to the ED if the symptoms worsen or if the condition fails to improve as anticipated.  I have spent a total of 10 minutes of non-face-to-face time, excluding clinical staff time, reviewing notes and preparing to see patient, ordering tests and/or medications, and counseling the patient.    Coral Ceo, MD Center for [facility], [facility], [facility] 03/14/21

## 2021-10-24 IMAGING — US US PELVIS COMPLETE WITH TRANSVAGINAL
1 series · 13 of 25 positions shown · non-contrast
Comparison: 05/09/2020

CLINICAL DATA: Pelvic pain for 2 weeks, has IUD, LMP 09/02/2020

EXAM:
TRANSABDOMINAL AND TRANSVAGINAL ULTRASOUND OF PELVIS
TECHNIQUE: Both transabdominal and transvaginal ultrasound examinations of the
pelvis were performed. Transabdominal technique was performed for
global imaging of the pelvis including uterus, ovaries, adnexal
regions, and pelvic cul-de-sac. It was necessary to proceed with
endovaginal exam following the transabdominal exam to visualize the
endometrium and IUD.

[Series 1: us pelvic complete with transvaginal · 13 of 62 slices shown]
[im 1/62]
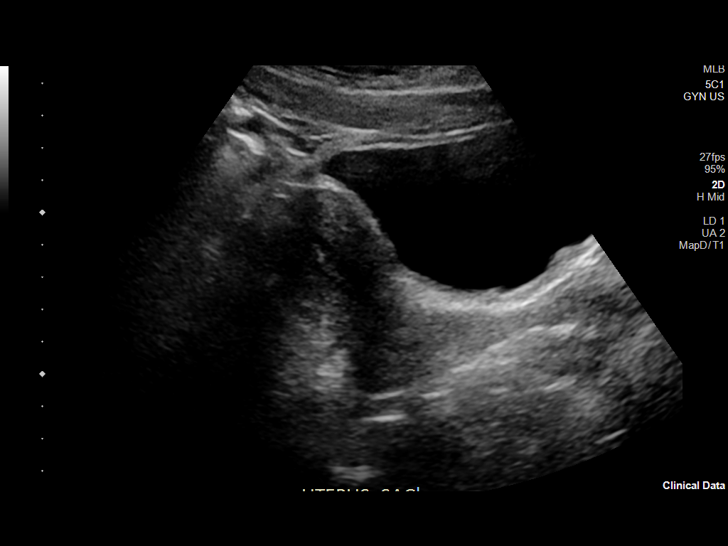
[im 6/62]
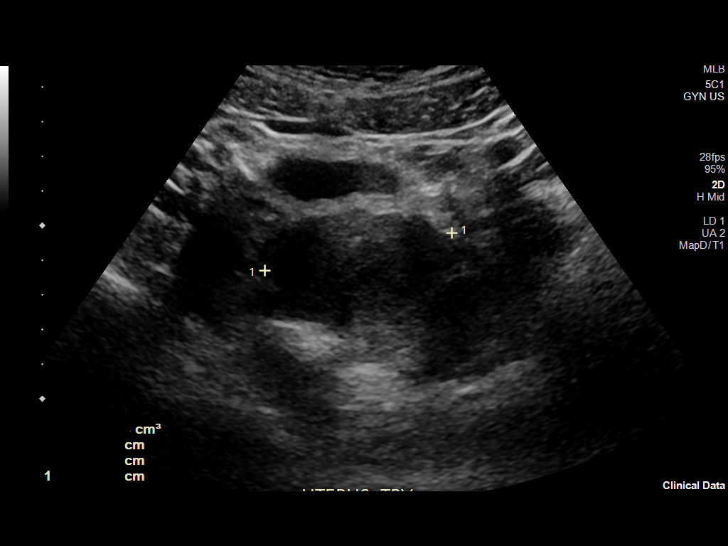
[im 11/62]
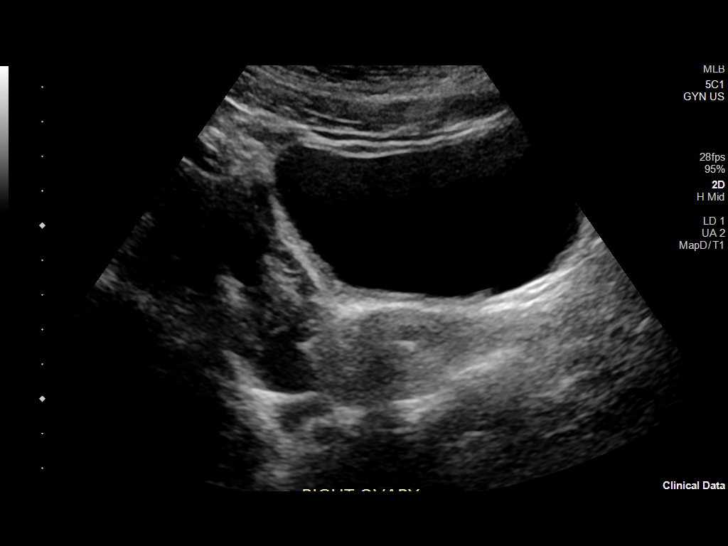
[im 16/62]
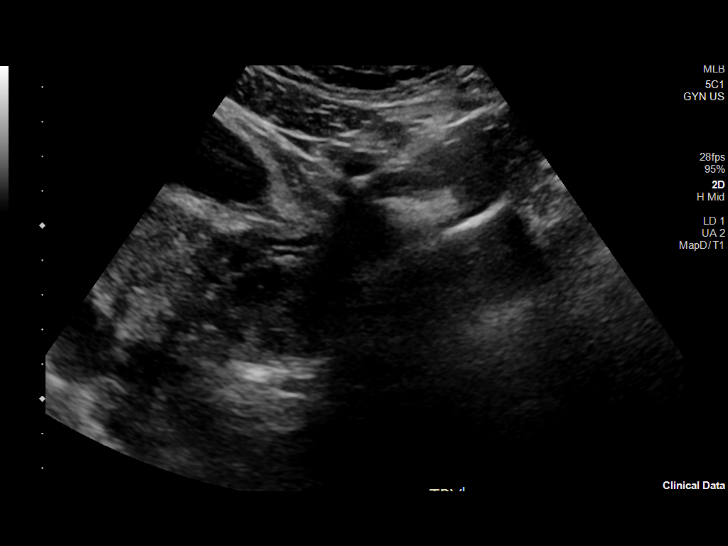
[im 21/62]
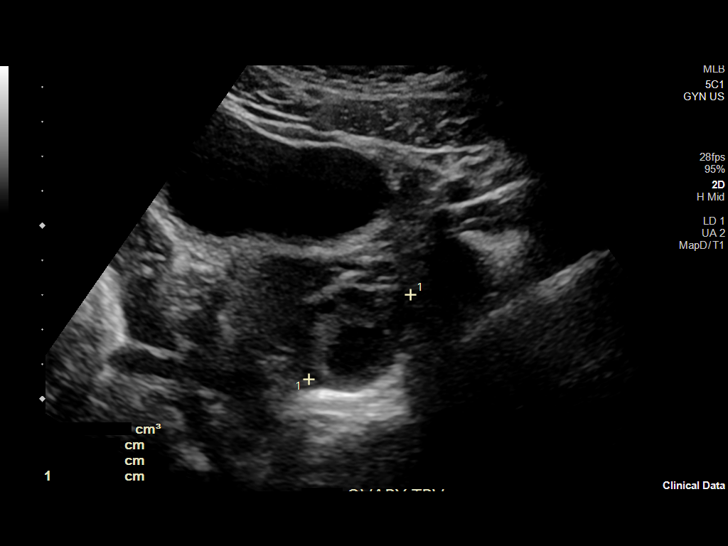
[im 26/62]
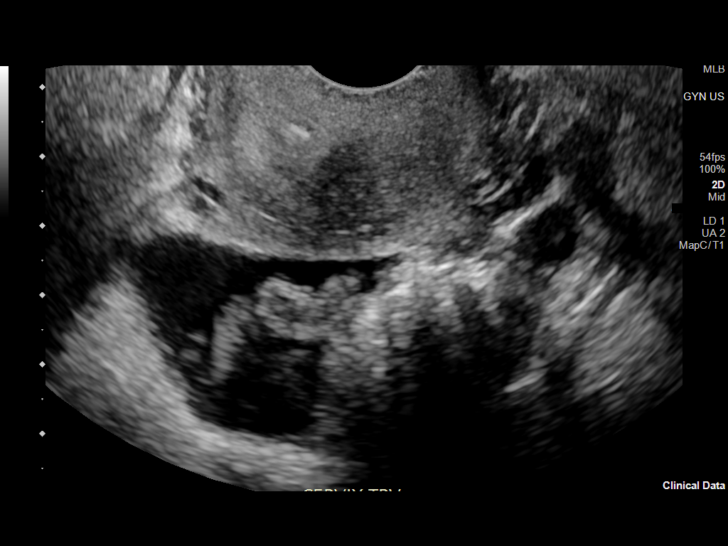
[im 31/62]
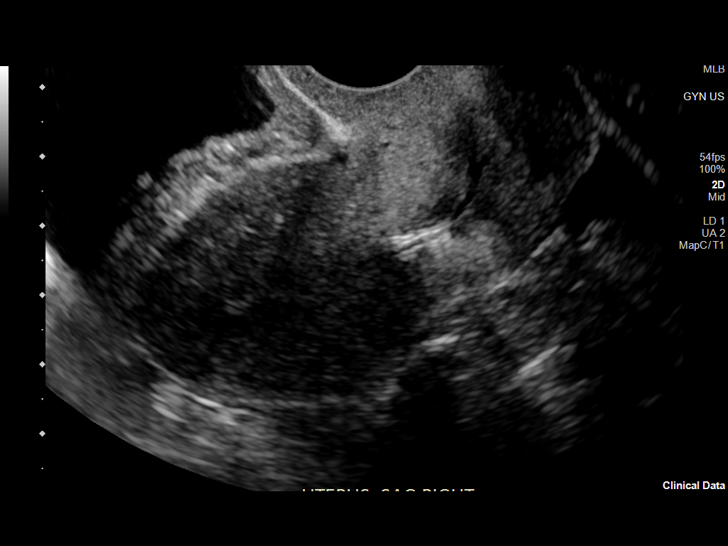
[im 36/62]
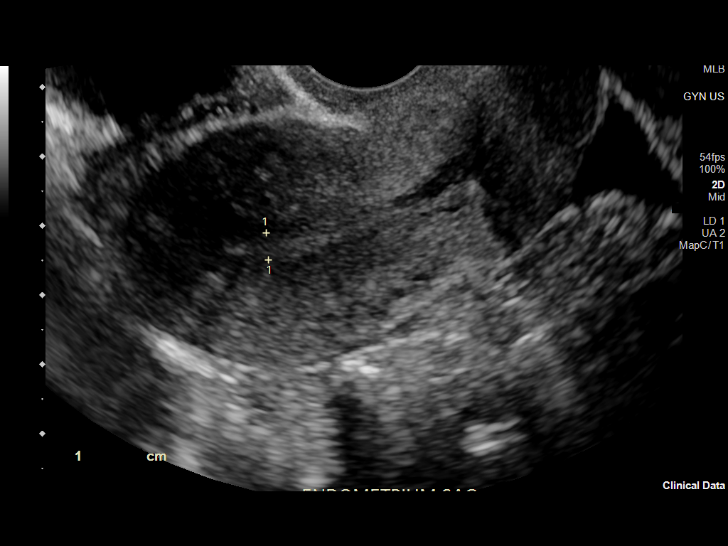
[im 41/62]
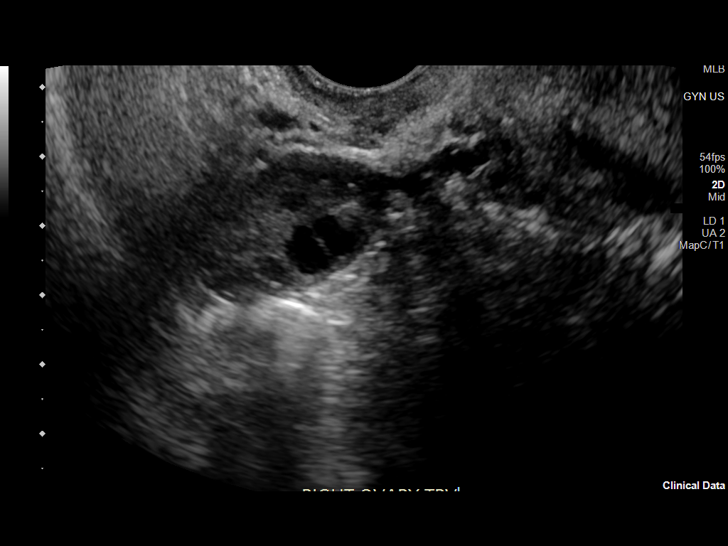
[im 46/62]
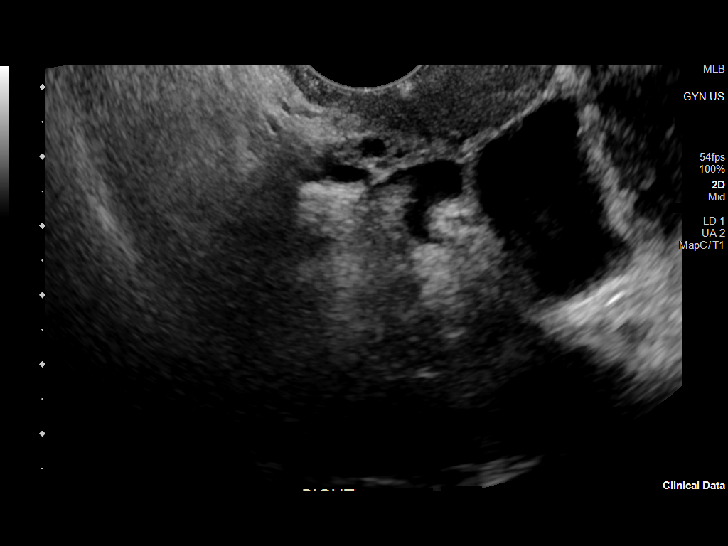
[im 51/62]
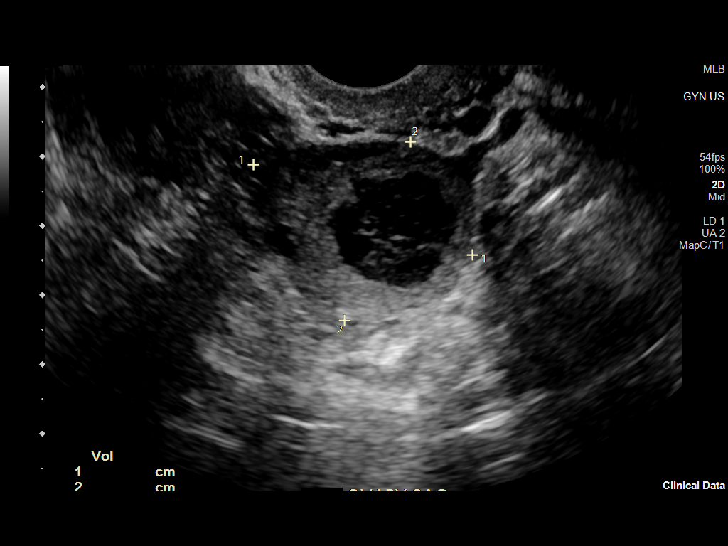
[im 56/62]
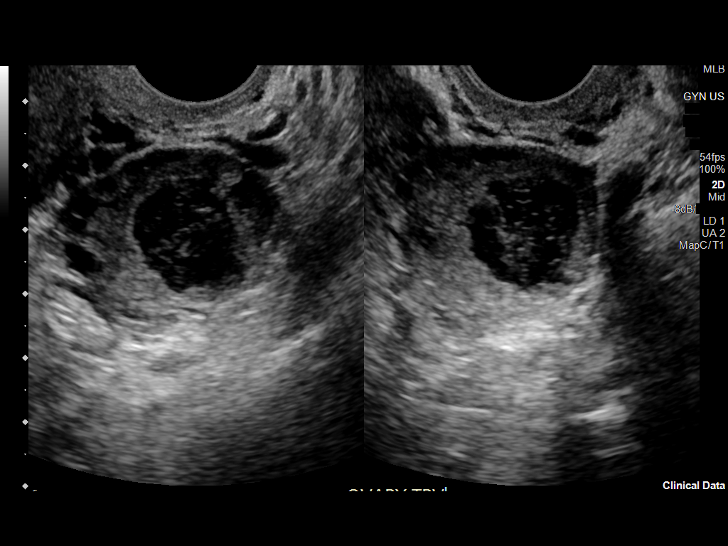
[im 62/62]
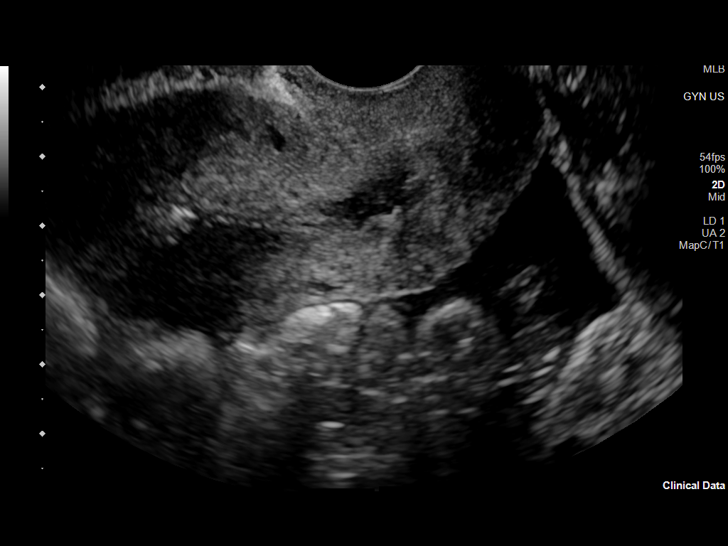

[13 of 25 positions shown; findings below may reference images not displayed]

FINDINGS: Uterus

Measurements: 7.3 x 3.4 x 5.5 cm = volume: 72 mL. Anteverted. Normal
morphology without mass

Endometrium

Thickness: 4 mm. IUD in expected position at the upper uterine
segment endometrial canal. No endometrial fluid or mass

Right ovary

Measurements: 3.3 x 2.0 x 3.1 cm = volume: 10.9 mL. Normal
morphology without mass

Left ovary

Measurements: 3.4 x 2.8 x 3.2 cm = volume: 15.5 mL. Small
hemorrhagic corpus luteum. No additional masses.

Other findings

Small amount of nonspecific free pelvic fluid.  No adnexal masses.
IMPRESSION: IUD in expected position at upper uterine segment endometrial canal.

No other significant pelvic sonographic findings.

## 2022-02-07 ENCOUNTER — Other Ambulatory Visit: Payer: Self-pay

## 2022-02-07 ENCOUNTER — Encounter (HOSPITAL_BASED_OUTPATIENT_CLINIC_OR_DEPARTMENT_OTHER): Payer: Self-pay

## 2022-02-07 ENCOUNTER — Emergency Department (HOSPITAL_BASED_OUTPATIENT_CLINIC_OR_DEPARTMENT_OTHER)
Admission: EM | Admit: 2022-02-07 | Discharge: 2022-02-07 | Disposition: A | Discharge status: Home / Self Care | Payer: Medicare Other | Attending: Emergency Medicine | Admitting: Emergency Medicine

## 2022-02-07 DIAGNOSIS — Z7951 Long term (current) use of inhaled steroids: Secondary | ICD-10-CM | POA: Insufficient documentation

## 2022-02-07 DIAGNOSIS — R52 Pain, unspecified: Secondary | ICD-10-CM

## 2022-02-07 DIAGNOSIS — J45909 Unspecified asthma, uncomplicated: Secondary | ICD-10-CM | POA: Diagnosis not present

## 2022-02-07 DIAGNOSIS — Z91040 Latex allergy status: Secondary | ICD-10-CM | POA: Diagnosis not present

## 2022-02-07 DIAGNOSIS — U071 COVID-19: Secondary | ICD-10-CM | POA: Diagnosis not present

## 2022-02-07 DIAGNOSIS — R112 Nausea with vomiting, unspecified: Secondary | ICD-10-CM

## 2022-02-07 DIAGNOSIS — R509 Fever, unspecified: Secondary | ICD-10-CM | POA: Diagnosis present

## 2022-02-07 LAB — GROUP A STREP BY PCR: Group A Strep by PCR: NOT DETECTED

## 2022-02-07 LAB — RESP PANEL BY RT-PCR (RSV, FLU A&B, COVID)  RVPGX2
Influenza A by PCR: NEGATIVE
Influenza B by PCR: NEGATIVE
Resp Syncytial Virus by PCR: NEGATIVE
SARS Coronavirus 2 by RT PCR: POSITIVE — AB

## 2022-02-07 MED ORDER — ONDANSETRON HCL 4 MG PO TABS: 4.0000 mg | ORAL_TABLET | Freq: Three times a day (TID) | ORAL | 0 refills | Status: DC | PRN

## 2022-02-07 MED ORDER — ONDANSETRON 4 MG PO TBDP
4.0000 mg | ORAL_TABLET | Freq: Once | ORAL | Status: AC
  Administered 2022-02-07: 4 mg via ORAL
  Filled 2022-02-07: qty 1

## 2022-02-07 MED ORDER — ACETAMINOPHEN 500 MG PO TABS
1000.0000 mg | ORAL_TABLET | Freq: Once | ORAL | Status: AC | PRN
  Administered 2022-02-07: 1000 mg via ORAL
  Filled 2022-02-07: qty 2

## 2022-02-07 NOTE — Discharge Instructions (Addendum)
You were evaluated today for fever, body aches, chills.  Your strep test was negative.  Your Covid-19 test was positive.  I recommend taking over-the-counter medication such as acetaminophen and ibuprofen as needed for fever and pain control.  Please be sure to drink plenty of fluids and get rest as you are able.  I have prescribed a medication called Zofran for nausea.  Please take as directed.  Follow-up as needed with your primary care provider.

## 2022-02-07 NOTE — ED Triage Notes (Signed)
Patient here POV from Home.  Endorses Nausea, Emesis, Cough, Fever, Aches that began Last PM and Worsened today.   NAD Noted during Triage. A&Ox4. GCS 15. Ambulatory.

## 2022-02-07 NOTE — ED Notes (Signed)
Discharge paperwork gvien and verbally understood.

## 2022-02-07 NOTE — ED Provider Notes (Signed)
[facility] EMERGENCY DEPT Provider Note   CSN: [id number] Arrival date & time: 02/07/22  1132     History  Chief Complaint  Patient presents with   Fever    Jessica Vargas is a 22 y.o. female.  Patient presents the emergency department complaining of nausea, vomiting, cough, fever, body aches which began last night and worsened overnight and into today.  Patient denies shortness of breath, chest pain, abdominal pain, urinary symptoms.  Past medical history significant for anxiety, depression, asthma, schizoaffective disorder  HPI     Home Medications Prior to Admission medications   Medication Sig Start Date End Date Taking? Authorizing Provider  ondansetron (ZOFRAN) 4 MG tablet Take 1 tablet (4 mg total) by mouth every 8 (eight) hours as needed for nausea or vomiting. 02/07/22  Yes Dorothyann Peng, PA-C  albuterol (PROVENTIL HFA;VENTOLIN HFA) 108 (90 Base) MCG/ACT inhaler Inhale 2 puffs into the lungs every 4 (four) hours as needed for wheezing or shortness of breath. 04/10/16   Wynona Luna, MD  ARIPiprazole (ABILIFY) 5 MG tablet Take 5 mg by mouth at bedtime. 02/14/21   [provider]  cefUROXime (CEFTIN) 500 MG tablet Take 1 tablet (500 mg total) by mouth 2 (two) times daily with a meal. 10/06/20   Shelly Bombard, MD  diphenhydrAMINE (BENADRYL) 25 mg capsule Take by mouth.    [provider]  DULoxetine (CYMBALTA) 30 MG capsule Take 30 mg by mouth at bedtime. 02/11/21   [provider]  fluconazole (DIFLUCAN) 150 MG tablet TAKE 1 TABLET(150 MG) BY MOUTH 1 TIME FOR 1 DOSE 01/04/21   Shelly Bombard, MD  ibuprofen (ADVIL) 800 MG tablet Take 1 tablet (800 mg total) by mouth every 8 (eight) hours as needed. 10/06/20   Shelly Bombard, MD  prazosin (MINIPRESS) 2 MG capsule Take 2 mg by mouth at bedtime. 03/06/21   [provider]      Allergies    Latex and Risperidone    Review of Systems   Review of Systems   Constitutional:  Positive for chills and fever.  Respiratory:  Negative for shortness of breath.   Gastrointestinal:  Positive for nausea and vomiting.  Musculoskeletal:  Positive for myalgias.    Physical Exam Updated Vital Signs BP (!) 115/57 (BP Location: Right Arm)   Pulse (!) 118   Temp (!) 101.1 F (38.4 C) (Oral)   Resp 16   Ht 5\' 6"  (1.676 m)   Wt 96.6 kg   SpO2 100%   BMI 34.37 kg/m  Physical Exam Vitals and nursing note reviewed.  Constitutional:      General: She is not in acute distress.    Appearance: She is well-developed.  HENT:     Head: Normocephalic and atraumatic.     Right Ear: Tympanic membrane normal.     Left Ear: Tympanic membrane normal.     Nose: Nose normal.     Mouth/Throat:     Mouth: Mucous membranes are moist.     Pharynx: Posterior oropharyngeal erythema present. No oropharyngeal exudate.  Eyes:     Conjunctiva/sclera: Conjunctivae normal.  Cardiovascular:     Rate and Rhythm: Normal rate and regular rhythm.     Heart sounds: No murmur heard. Pulmonary:     Effort: Pulmonary effort is normal. No respiratory distress.     Breath sounds: Normal breath sounds.  Abdominal:     Palpations: Abdomen is soft.     Tenderness: There is  no abdominal tenderness.  Musculoskeletal:        General: No swelling.     Cervical back: Neck supple.  Skin:    General: Skin is warm and dry.     Capillary Refill: Capillary refill takes less than 2 seconds.  Neurological:     Mental Status: She is alert.  Psychiatric:        Mood and Affect: Mood normal.     ED Results / Procedures / Treatments   Labs (all labs ordered are listed, but only abnormal results are displayed) Labs Reviewed  RESP PANEL BY RT-PCR (RSV, FLU A&B, COVID)  [id number] - Abnormal; Notable for the following components:      Result Value   SARS Coronavirus 2 by RT PCR POSITIVE (*)    All other components within normal limits  GROUP A STREP BY PCR    EKG None  Radiology No  results found.  Procedures Procedures    Medications Ordered in ED Medications  ondansetron (ZOFRAN-ODT) disintegrating tablet 4 mg (has no administration in time range)  acetaminophen (TYLENOL) tablet 1,000 mg (1,000 mg Oral Given 02/07/22 1149)    ED Course/ Medical Decision Making/ A&P                           Medical Decision Making Risk OTC drugs.   Patient presents with chief complaint of nausea, vomiting, body aches, fever.  Differential diagnosis includes but is not limited to COVID-19, influenza, strep throat, RSV, other viral illnesses, and others  The patient has no recent relevant past medical history on file  I ordered and reviewed labs.  Pertinent results include negative group A strep test, positive Covid-19  There is medication at this time for imaging  I ordered the patient Zofran for nausea and Tylenol for fever and pain.  Upon reassessment the patient was feeling somewhat better  Plan to discharge patient home with instructions for supportive care.  Patient is positive for Covid-19. Patient will use over-the-counter medication such as acetaminophen and ibuprofen as needed for fever and pain control.  I recommended rest and fluids.  I have prescribed Zofran for nausea.  Return precautions provided        Final Clinical Impression(s) / ED Diagnoses Final diagnoses:  Nausea and vomiting, unspecified vomiting type  Generalized body aches  COVID-19    Rx / DC Orders ED Discharge Orders          Ordered    ondansetron (ZOFRAN) 4 MG tablet  Every 8 hours PRN        02/07/22 1259              Ronny Bacon 02/07/22 1303    Lennice Sites, DO 02/07/22 1337

## 2022-03-06 ENCOUNTER — Ambulatory Visit: Payer: Medicare Other | Admitting: Obstetrics

## 2022-03-12 ENCOUNTER — Ambulatory Visit (INDEPENDENT_AMBULATORY_CARE_PROVIDER_SITE_OTHER): Payer: Medicare Other | Admitting: Obstetrics and Gynecology

## 2022-03-12 ENCOUNTER — Encounter: Payer: Self-pay | Admitting: Obstetrics and Gynecology

## 2022-03-12 VITALS — BP 125/76 | HR 88 | Ht 66.0 in | Wt 205.6 lb

## 2022-03-12 DIAGNOSIS — R102 Pelvic and perineal pain: Secondary | ICD-10-CM | POA: Diagnosis not present

## 2022-03-12 DIAGNOSIS — R4586 Emotional lability: Secondary | ICD-10-CM

## 2022-03-12 DIAGNOSIS — R635 Abnormal weight gain: Secondary | ICD-10-CM | POA: Diagnosis not present

## 2022-03-12 DIAGNOSIS — N920 Excessive and frequent menstruation with regular cycle: Secondary | ICD-10-CM | POA: Diagnosis not present

## 2022-03-12 DIAGNOSIS — R030 Elevated blood-pressure reading, without diagnosis of hypertension: Secondary | ICD-10-CM

## 2022-03-12 NOTE — Patient Instructions (Addendum)
To Do: - Follow up in 2 weeks to take out IUD & check blood pressure - Start birth control pills if blood pressure is normal - Follow up with primary care about nausea - Our behavioral health specialist will reach out to you to connect with therapist - Pelvic floor physical therapy (depending on pain during IUD removal) - Follow up thyroid hormone levels

## 2022-03-12 NOTE — Progress Notes (Signed)
   RETURN GYNECOLOGY VISIT  Subjective:  Jessica Vargas is a 22 y.o. G0P0000 with mIUD in place presenting with multiple concerns.  Reports the following symptoms have been on and off for years: - abdominal/pelvic pain w/ cramping and sharp pains that occur outside of period and are worse with her period. Has pain with intercourse - irregular periods - mood swings, irritability, PMS - hot flashes & night sweats - nausea - acne & excessive hair growth - fatigue - bleeding with bowel movement - weight gain - vaginal odor   Her most bothersome symptoms are her pain, irregular bleeding and nausea. Has had an IUD in place for several years, was removed & replaced 04/26/20. Reports irregular but monthly cycles prior to IUD. Now has cycles lasting two weeks and occur every 14-28 days. Reports bothersome heavy bleeding despite IUD. Her nausea is present constantly. Has tried peppermint tea with some improvement. Has not had any additional work up. Is tolerating PO.   For her mood symptoms she has a neurologist. She does not follow with a therapist or psychologist. She is interested in a referral to [facility].   I personally reviewed her normal pelvic ultrasound in 2022  PMH: autism, ADHD, schizoaffective, depression/anxiety, asthma PSH: none Meds: abilify, duloxetine, prazisub All: latex, risperidone OB: G0 Pap Hx: NILM 01/06/20  Objective:   Vitals:   03/12/22 1604 03/12/22 1644  BP: (!) 141/83 125/76  Pulse: 97 88  Weight: 205 lb 9.6 oz (93.3 kg)   Height: 5\' 6"  (1.676 m)    General:  Alert, oriented and cooperative. Patient is in no acute distress.  Skin: Skin is warm and dry. No rash noted.   Cardiovascular: Normal heart rate noted  Respiratory: Normal respiratory effort, no problems with respiration noted  Abdomen: Soft, nontender    Assessment and Plan:  Jessica Vargas is a 22 y.o. with multiple concerns  Diagnoses and all orders for this visit:  Menorrhagia with regular  cycle Pelvic pain - Discussed management options including Lysteda w/ IUD in place, IUD removal, COCs/POPs, patch, ring, Depo, implant. Reviewed HTN as contraindication to estrogen containing methods, so we need to be sure her elevated blood pressure today is not her new normal. Fortunately repeat was normotensive. Pt is most interested in IUD removal and initiation of COCs. Discussed that COCs may help acne, hair growth, irregular cycle. Reviewed that COCs may improve or worsen mood symptoms and could worsen nausea - Pt will RTC in 2 weeks for IUD removal, BP check and likely initiation of COCs - Will assess for levator/obturator tenderness w/ next appt and refer to PFPT prn -     CBC -     TSH  Weight gain Nausea Discussed PCP follow up for evaluation of GI causes -     TSH  Mood swings -     Amb ref to [facility] - to assist with resources and establishing care with therapist/counselor  Elevated blood pressure reading Discussed HTN as contraindication to COCs. Recheck normotensive If normotensive at IUD removal, will rx COCs  Return in about 2 weeks (around 03/26/2022) for IUD removal.  Future Appointments  Date Time Provider [place]  03/26/2022  3:50 PM Inez Catalina, MD [place] None    Inez Catalina, MD

## 2022-03-12 NOTE — Progress Notes (Signed)
Pt presents for hormonal imbalance and [facility] consult. Pt has Mirena IUD, place 1 year ago. Pt reports hormonal changes including, mood swings, night sweats, acne, excessive hair growth and bleeding after bowl movement. Pt interested in non hormonal BC.

## 2022-03-13 LAB — TSH: TSH: 0.897 u[IU]/mL (ref 0.450–4.500)

## 2022-03-13 LAB — CBC
Hematocrit: 37.1 % (ref 34.0–46.6)
Hemoglobin: 11.6 g/dL (ref 11.1–15.9)
MCH: 22.9 pg — ABNORMAL LOW (ref 26.6–33.0)
MCHC: 31.3 g/dL — ABNORMAL LOW (ref 31.5–35.7)
MCV: 73 fL — ABNORMAL LOW (ref 79–97)
Platelets: 333 10*3/uL (ref 150–450)
RBC: 5.06 x10E6/uL (ref 3.77–5.28)
RDW: 13.7 % (ref 11.7–15.4)
WBC: 6.6 10*3/uL (ref 3.4–10.8)

## 2022-03-26 ENCOUNTER — Ambulatory Visit (INDEPENDENT_AMBULATORY_CARE_PROVIDER_SITE_OTHER): Payer: Medicare Other | Admitting: Obstetrics and Gynecology

## 2022-03-26 ENCOUNTER — Other Ambulatory Visit (HOSPITAL_COMMUNITY)
Admission: RE | Admit: 2022-03-26 | Discharge: 2022-03-26 | Disposition: A | Discharge status: Home / Self Care | Payer: Medicare Other | Source: Ambulatory Visit | Attending: Obstetrics and Gynecology | Admitting: Obstetrics and Gynecology

## 2022-03-26 ENCOUNTER — Encounter: Payer: Self-pay | Admitting: Obstetrics and Gynecology

## 2022-03-26 ENCOUNTER — Institutional Professional Consult (permissible substitution): Payer: Medicare Other | Admitting: Licensed Clinical Social Worker

## 2022-03-26 VITALS — BP 132/81 | HR 81 | Ht 66.0 in | Wt 206.0 lb

## 2022-03-26 DIAGNOSIS — R102 Pelvic and perineal pain: Secondary | ICD-10-CM | POA: Diagnosis present

## 2022-03-26 DIAGNOSIS — Z30432 Encounter for removal of intrauterine contraceptive device: Secondary | ICD-10-CM

## 2022-03-26 DIAGNOSIS — Z113 Encounter for screening for infections with a predominantly sexual mode of transmission: Secondary | ICD-10-CM | POA: Diagnosis not present

## 2022-03-26 MED ORDER — NORETHIN ACE-ETH ESTRAD-FE 1-20 MG-MCG(24) PO TABS: 1.0000 | ORAL_TABLET | Freq: Every day | ORAL | 11 refills | Status: DC

## 2022-03-26 NOTE — Progress Notes (Signed)
Pt presents for IUD removal. Pt desires BC pills.

## 2022-03-26 NOTE — Progress Notes (Signed)
   RETURN GYNECOLOGY VISIT  Subjective:  Jessica Vargas is a 22 y.o. G0P0000 with IUD in place presenting for follow up of pelvic pain & removal of IUD  See my note from 2/12 for details, but pt has history of cyclic abdominal/pelvic pain, dyspareunia, and irregular bleeding. She had IUD placed in 2023 that was not helping her symptoms. We discussed options for management, and she desired IUD removal and initiation of COCs. She initially had an elevated BP at that appointment that was normal on recheck. Discussed need for repeat BP eval before prescribing COCs due to inappropriate risk in women w/ HTN.   We also obtained a CBC & TSH for her bleeding that were both normal.   Objective:   Vitals:   03/26/22 1607  BP: 132/81  Pulse: 81  Weight: 206 lb (93.4 kg)  Height: 5' 6"$  (1.676 m)    General:  Alert, oriented and cooperative. Patient is in no acute distress.  Skin: Skin is warm and dry. No rash noted.   Cardiovascular: Normal heart rate noted  Respiratory: Normal respiratory effort, no problems with respiration noted  Abdomen: Soft, non-tender, non-distended   Pelvic: NEFG. +levator & obturator tenderness. Normal cervix.   Exam performed in the presence of a chaperone  Assessment and Plan:  Jessica Vargas is a 22 y.o. with chronic pelvic pain & desire for IUD removal  1. Encounter for IUD removal Uncomplicated. See procedure note below.  2. Pelvic pain Levator & obturator tenderness present on exam Discussed PFPT & COCs - Ambulatory referral to Physical Therapy -     Norethindrone Acetate-Ethinyl Estrad-FE (LOESTRIN 24 FE) 1-20 MG-MCG(24) tablet; Take 1 tablet by mouth daily.  3. Routine screening for STI - Cervicovaginal ancillary only( [place])  RTC in 3 months for symptom follow up  Inez Catalina, MD      GYNECOLOGY OFFICE PROCEDURE NOTE  Jessica Vargas is a 22 y.o. G0P0000 here for IUD removal.  Last pap smear was on 01/06/20 and was normal.  IUD  Removal  Patient identified, informed consent performed, consent signed.  Patient was in the dorsal lithotomy position, normal external genitalia was noted.  A speculum was placed in the patient's vagina, normal discharge was noted, no lesions. The cervix was visualized, no lesions, no abnormal discharge.  The strings of the IUD were grasped and pulled using ring forceps. The IUD was removed in its entirety.   Patient tolerated the procedure well.    Patient will use COCs for contraception.  Routine preventative health maintenance measures emphasized.  Gale Journey, MD [place], [facility] for [employer], [place]

## 2022-03-28 LAB — CERVICOVAGINAL ANCILLARY ONLY
Bacterial Vaginitis (gardnerella): POSITIVE — AB
Candida Glabrata: NEGATIVE
Candida Vaginitis: NEGATIVE
Chlamydia: NEGATIVE
Comment: NEGATIVE
Comment: NEGATIVE
Comment: NEGATIVE
Comment: NEGATIVE
Comment: NEGATIVE
Comment: NORMAL
Neisseria Gonorrhea: NEGATIVE
Trichomonas: NEGATIVE

## 2022-04-23 ENCOUNTER — Ambulatory Visit: Payer: Medicare Other | Admitting: Obstetrics and Gynecology

## 2023-02-03 ENCOUNTER — Encounter: Payer: Self-pay | Admitting: Licensed Clinical Social Worker

## 2023-02-07 ENCOUNTER — Other Ambulatory Visit: Payer: Self-pay | Admitting: Obstetrics and Gynecology

## 2023-12-02 NOTE — Progress Notes (Unsigned)
   ANNUAL EXAM Patient name: Jessica Vargas MRN [id number]  Date of birth: 2000/10/25 Chief Complaint:   No chief complaint on file.  History of Present Illness:   Jessica Vargas is a 23 y.o. G0P0000 female being seen today for a routine annual exam.   Current concerns: ***  Current birth control: ***  No LMP recorded. (Menstrual status: IUD).   Gardasil: Completed Last Pap/Pap History:  2021: pap normal  Review of Systems:   Pertinent items are noted in HPI Denies any headaches, blurred vision, fatigue, shortness of breath, chest pain, abdominal pain, abnormal vaginal discharge/itching/odor/irritation, problems with periods, bowel movements, urination, or intercourse unless otherwise stated above. *** Pertinent History Reviewed:  Reviewed past medical,surgical, social and family history.  Reviewed problem list, medications and allergies. Physical Assessment:  There were no vitals filed for this visit.There is no height or weight on file to calculate BMI.   Physical Examination:  General appearance - well appearing, and in no distress Mental status - alert, oriented to person, place, and time Psych:  She has a normal mood and affect Skin - warm and dry, normal color, no suspicious lesions noted Chest - effort normal Heart - normal rate  Breasts - breasts appear normal, no suspicious masses, no skin or nipple changes or axillary nodes Abdomen - soft, nontender, nondistended, no masses or organomegaly Pelvic -  {Blank single:19197::Performed and:,declines,not indicated} VULVA: {Blank single:19197::Not examined,normal appearing vulva with no masses, tenderness or lesions,***} VAGINA: {Blank single:19197::Not examined,normal appearing vagina with normal color and discharge, no lesions,***} CERVIX: {Blank single:19197::Not examined,normal appearing cervix without discharge or lesions, no CMT.,***} UTERUS: {Blank single:19197::Not examined,uterus is felt to  be normal size, shape, consistency and nontender,***} ADNEXA: {Blank single:19197::Not examined,No adnexal masses or tenderness noted,***} Extremities:  No swelling or varicosities noted  Chaperone present for exam  No results found for this or any previous visit (from the past 24 hours).  Assessment & Plan:  Diagnoses and all orders for this visit:  Encounter for annual routine gynecological examination  - Cervical cancer screening: Discussed screening Q3 years. Reviewed importance of annual exams and limits of pap smear. Pap with reflex HPV done - GC/CT: Discussed and recommended. Pt  {Blank single:19197::accepts,declines} - Gardasil: {Blank single:19197::***,has not yet had. Will provide information,completed,has not yet had. Counseling provided and she declines,Has not yet had. Counseling provided and pt accepts} - Birth Control: OCPs - Breast Health: Encouraged self breast awareness/exams. Teaching provided. - Follow-up: 12 months and prn     No orders of the defined types were placed in this encounter.   Meds: No orders of the defined types were placed in this encounter.   Follow-up: No follow-ups on file.  Vina Solian, MD 12/02/2023 1:21 PM

## 2023-12-03 ENCOUNTER — Encounter: Payer: Self-pay | Admitting: Obstetrics and Gynecology

## 2023-12-03 ENCOUNTER — Other Ambulatory Visit (HOSPITAL_COMMUNITY)
Admission: RE | Admit: 2023-12-03 | Discharge: 2023-12-03 | Disposition: A | Discharge status: Home / Self Care | Source: Ambulatory Visit | Attending: Obstetrics and Gynecology | Admitting: Obstetrics and Gynecology

## 2023-12-03 ENCOUNTER — Ambulatory Visit (INDEPENDENT_AMBULATORY_CARE_PROVIDER_SITE_OTHER): Admitting: Obstetrics and Gynecology

## 2023-12-03 VITALS — BP 128/78 | HR 92 | Ht 66.0 in | Wt 189.0 lb

## 2023-12-03 DIAGNOSIS — N760 Acute vaginitis: Secondary | ICD-10-CM | POA: Insufficient documentation

## 2023-12-03 DIAGNOSIS — B9689 Other specified bacterial agents as the cause of diseases classified elsewhere: Secondary | ICD-10-CM

## 2023-12-03 DIAGNOSIS — Z01419 Encounter for gynecological examination (general) (routine) without abnormal findings: Secondary | ICD-10-CM | POA: Insufficient documentation

## 2023-12-03 DIAGNOSIS — Z113 Encounter for screening for infections with a predominantly sexual mode of transmission: Secondary | ICD-10-CM

## 2023-12-03 DIAGNOSIS — Z23 Encounter for immunization: Secondary | ICD-10-CM | POA: Diagnosis not present

## 2023-12-03 MED ORDER — METRONIDAZOLE 0.75 % VA GEL: 1.0000 | Freq: Every day | VAGINAL | 1 refills | Status: AC

## 2023-12-03 MED ORDER — NORETHIN ACE-ETH ESTRAD-FE 1-20 MG-MCG PO TABS: 1.0000 | ORAL_TABLET | Freq: Every day | ORAL | 3 refills | Status: AC

## 2023-12-03 MED ORDER — METRONIDAZOLE 0.75 % VA GEL: 1.0000 | VAGINAL | 5 refills | Status: AC

## 2023-12-03 NOTE — Progress Notes (Signed)
 Pt would like to discuss changing BC pills, pt will do STD screening.  Pt would like flu vaccine today.  GAD and PHQ elevated today, will place referral.

## 2023-12-04 ENCOUNTER — Ambulatory Visit: Payer: Self-pay | Admitting: Obstetrics and Gynecology

## 2023-12-04 LAB — CERVICOVAGINAL ANCILLARY ONLY
Bacterial Vaginitis (gardnerella): NEGATIVE
Candida Glabrata: NEGATIVE
Candida Vaginitis: NEGATIVE
Chlamydia: NEGATIVE
Comment: NEGATIVE
Comment: NEGATIVE
Comment: NEGATIVE
Comment: NEGATIVE
Comment: NEGATIVE
Comment: NORMAL
Neisseria Gonorrhea: NEGATIVE
Trichomonas: NEGATIVE

## 2023-12-04 LAB — HEPATITIS C ANTIBODY: Hep C Virus Ab: NONREACTIVE

## 2023-12-04 LAB — RPR: RPR Ser Ql: NONREACTIVE

## 2023-12-04 LAB — HIV ANTIBODY (ROUTINE TESTING W REFLEX): HIV Screen 4th Generation wRfx: NONREACTIVE

## 2023-12-04 LAB — HEPATITIS B SURFACE ANTIGEN: Hepatitis B Surface Ag: NEGATIVE

## 2023-12-05 LAB — CYTOLOGY - PAP: Diagnosis: NEGATIVE

## 2023-12-11 ENCOUNTER — Ambulatory Visit (INDEPENDENT_AMBULATORY_CARE_PROVIDER_SITE_OTHER): Payer: MEDICAID | Admitting: Licensed Clinical Social Worker

## 2023-12-11 DIAGNOSIS — F4323 Adjustment disorder with mixed anxiety and depressed mood: Secondary | ICD-10-CM

## 2023-12-11 NOTE — BH Specialist Note (Signed)
 Integrated Behavioral Health via Telemedicine Visit  12/17/2023 Bellami Farrelly Meske [id number]  Number of Integrated Behavioral Health Clinician visits: 1- Initial Visit  Session Start time: 1515   Session End time: 1622  Total time in minutes: 67    Referring Provider: Cleatus, MD Patient/Family location: Home [facility] Provider location: Work All persons participating in visit: Patient and [facility] Types of Service: Individual psychotherapy and Telephone visit  I connected with Emireth D Mannis and/or Mckala D Rusconi's patient via  Telephone or Video Enabled Telemedicine Application  (Video is Caregility application) and verified that I am speaking with the correct person using two identifiers. Discussed confidentiality: Yes   I discussed the limitations of telemedicine and the availability of in person appointments.  Discussed there is a possibility of technology failure and discussed alternative modes of communication if that failure occurs.  I discussed that engaging in this telemedicine visit, they consent to the provision of behavioral healthcare and the services will be billed under their insurance.  Patient and/or legal guardian expressed understanding and consented to Telemedicine visit: Yes   Presenting Concerns: Patient and/or family reports the following symptoms/concerns: Increased stressors and interpersonal relationships Duration of problem: Months; Severity of problem: moderate  Patient and/or Family's Strengths/Protective Factors: Social and Emotional competence, Concrete supports in place (healthy food, safe environments, etc.), Physical Health (exercise, healthy diet, medication compliance, etc.), and Caregiver has knowledge of parenting & child development  Goals Addressed: Patient will:  Reduce symptoms of: anxiety and depression   Increase knowledge and/or ability of: coping skills and healthy habits   Demonstrate ability to: Increase healthy adjustment to current life  circumstances and Increase adequate support systems for patient/family  Progress towards Goals: Ongoing    Interventions: Interventions utilized:  Mindfulness or Management Consultant, Supportive Counseling, Psychoeducation and/or Health Education, and Supportive Reflection Standardized Assessments completed: Not Needed    Patient and/or Family Response: Patient attended today's session and reported that she is currently employed while also searching for additional employment. She recently moved from her apartment to her mother's home due to increased rent and shared that she previously lived with her boyfriend, who is now deployed for eight months and has experienced financial strain related to the government shutdown. The patient reported a history of schizoaffective disorder, major depressive disorder, anxiety, autism, and ADHD, noting that previous medication trials--including Prozac, Abilify, and fluoxetine--were discontinued due to lack of perceived benefit. She stated that her symptoms typically worsen during periods of stress, particularly related to finances and social interactions, and described ongoing challenges with being understood by others. Patient also discussed strained interactions with her father, who does not accept her diagnoses, leading to repeated boundary-setting for her emotional well-being. She identified current coping strategies such as music, television, face masks, soaking her feet, and using adult coloring books to manage stress.  Clinical Assessment/Diagnosis  Adjustment disorder with mixed anxiety and depressed mood    Assessment: Patient currently experiencing increased stress related to financial instability, housing changes, and interpersonal challenges, which in turn exacerbate her mental health symptoms. She is also struggling with social difficulties and strained family relationships, particularly with her father, leading to emotional distress..   Patient  may benefit from continued support of integrated behavioral health strategies.  Plan: Follow up with behavioral health clinician on : 12/19/2023 Behavioral recommendations:  continue to use healthy coping strategies for stress management while incorporating additional structured routines to support emotional regulation. She is also encouraged to strengthen boundaries in challenging relationships and explore social  skills or communication supports to reduce misunderstandings and improve social functioning. Referral(s): Integrated [employer] (In Clinic)  I discussed the assessment and treatment plan with the patient and/or parent/guardian. They were provided an opportunity to ask questions and all were answered. They agreed with the plan and demonstrated an understanding of the instructions.   They were advised to call back or seek an in-person evaluation if the symptoms worsen or if the condition fails to improve as anticipated.  Tanja Gift LITTIE Seats, LCSWA

## 2023-12-19 ENCOUNTER — Ambulatory Visit (INDEPENDENT_AMBULATORY_CARE_PROVIDER_SITE_OTHER): Admitting: Licensed Clinical Social Worker

## 2023-12-19 DIAGNOSIS — F4323 Adjustment disorder with mixed anxiety and depressed mood: Secondary | ICD-10-CM | POA: Diagnosis not present

## 2023-12-19 NOTE — BH Specialist Note (Signed)
 Integrated Behavioral Health via Telemedicine Visit  12/24/2023 Jessica Vargas [id number]  Number of Integrated Behavioral Health Clinician visits: 2- Second Visit  Session Start time: 1340   Session End time: 1407  Total time in minutes: 27    Referring Provider: Cleatus, MD Patient/Family location: Home [facility] Provider location: Work All persons participating in visit: Patient and [facility] Types of Service: Individual psychotherapy and Video visit  I connected with Kasumi D Sitzman and/or Angelys D Xiao's patient via  Telephone or Video Enabled Telemedicine Application  (Video is Caregility application) and verified that I am speaking with the correct person using two identifiers. Discussed confidentiality: Yes   I discussed the limitations of telemedicine and the availability of in person appointments.  Discussed there is a possibility of technology failure and discussed alternative modes of communication if that failure occurs.  I discussed that engaging in this telemedicine visit, they consent to the provision of behavioral healthcare and the services will be billed under their insurance.  Patient and/or legal guardian expressed understanding and consented to Telemedicine visit: Yes   Presenting Concerns: Patient and/or family reports the following symptoms/concerns: Ongoing interpersonal relationships.  Duration of problem: Months; Severity of problem: moderate  Patient and/or Family's Strengths/Protective Factors: Social and Emotional competence, Concrete supports in place (healthy food, safe environments, etc.), and Physical Health (exercise, healthy diet, medication compliance, etc.)  Goals Addressed: Patient will:  Reduce symptoms of: anxiety and depression   Increase knowledge and/or ability of: coping skills and healthy habits   Demonstrate ability to: Increase healthy adjustment to current life circumstances and Increase adequate support systems for  patient/family  Progress towards Goals: Ongoing    Interventions: Interventions utilized:  Mindfulness or Management Consultant, Supportive Counseling, Psychoeducation and/or Health Education, and Supportive Reflection Standardized Assessments completed: Not Needed    Patient and/or Family Response: The patient attended today's virtual session and reported actively engaging in various coping skills, including coloring, foot soaks, reorganizing, resting, cleaning, and taking personal time to help reduce symptoms. She shared that her sister and infant nephew have been staying in the home for the past two weeks, which has increased her stress due to household conflict and feeling responsible for additional cleaning. Despite these stressors, the patient noted that her relationship with her boyfriend remains stable, and they are planning to secure their own place when he returns from [place] [employer] duty.   Clinical Assessment/Diagnosis  Adjustment disorder with mixed anxiety and depressed mood    Assessment: Patient currently experiencing increased stress related to household conflict and added responsibilities since her sister and nephew moved in. Despite this, she is actively using coping skills and maintaining stability in her relationship..   Patient may benefit from continued integrated behavioral health services.  Plan: Follow up with behavioral health clinician on : 12/30/2023 Behavioral recommendations:  Patient to continue using and expanding her coping skills while setting healthy boundaries within the household to reduce stress. It is also recommended that she maintain open communication and support systems, particularly as she and her boyfriend plan for future transitions. Referral(s): Integrated [employer] (In Clinic)  I discussed the assessment and treatment plan with the patient and/or parent/guardian. They were provided an opportunity to ask questions and all were  answered. They agreed with the plan and demonstrated an understanding of the instructions.   They were advised to call back or seek an in-person evaluation if the symptoms worsen or if the condition fails to improve as anticipated.  Azarel Banner LITTIE Seats, LCSWA

## 2023-12-30 ENCOUNTER — Encounter: Admitting: Licensed Clinical Social Worker

## 2023-12-30 ENCOUNTER — Other Ambulatory Visit: Payer: Self-pay

## 2023-12-30 MED ORDER — FLUCONAZOLE 150 MG PO TABS: 150.0000 mg | ORAL_TABLET | Freq: Once | ORAL | 0 refills | Status: AC

## 2024-01-06 ENCOUNTER — Ambulatory Visit: Admitting: Licensed Clinical Social Worker

## 2024-01-06 NOTE — BH Specialist Note (Unsigned)
 Integrated Behavioral Health via Telemedicine Visit  01/06/2024 Jessica Vargas [id number]  Number of Integrated Behavioral Health Clinician visits: 2- Second Visit  Session Start time: 1340   Session End time: 1407  Total time in minutes: 27    Referring Provider: *** Patient/Family location: [facility] Provider location: *** All persons participating in visit: *** Types of Service: {CHL AMB TYPE OF [phone]}  I connected with Jessica Vargas and/or Jessica Vargas's {family members:20773} via  Telephone or Video Enabled Telemedicine Application  (Video is Caregility application) and verified that I am speaking with the correct person using two identifiers. Discussed confidentiality: {YES/NO:21197}  I discussed the limitations of telemedicine and the availability of in person appointments.  Discussed there is a possibility of technology failure and discussed alternative modes of communication if that failure occurs.  I discussed that engaging in this telemedicine visit, they consent to the provision of behavioral healthcare and the services will be billed under their insurance.  Patient and/or legal guardian expressed understanding and consented to Telemedicine visit: {YES/NO:21197}  Presenting Concerns: Patient and/or family reports the following symptoms/concerns: *** Duration of problem: ***; Severity of problem: {Mild/Moderate/Severe:20260}  Patient and/or Family's Strengths/Protective Factors: {CHL AMB BH PROTECTIVE FACTORS:[phone]}  Goals Addressed: Patient will:  Reduce symptoms of: {IBH Symptoms:21014056}   Increase knowledge and/or ability of: {IBH Patient Tools:21014057}   Demonstrate ability to: {IBH Goals:21014053}  Progress towards Goals: {CHL AMB BH PROGRESS TOWARDS [phone]}    Interventions: Interventions utilized:  {IBH Interventions:21014054} Standardized Assessments completed: {IBH Screening Tools:21014051}    Patient and/or  Family Response: Slowing down, living in the moment and being intentional about things.   Cons not having a serious conversation with her boyfriend. He's jokes around a lot, not serious and takes things too defensicve.   Clinical Assessment/Diagnosis  No diagnosis found.    Assessment: Patient currently experiencing ***.   Patient may benefit from ***.  Plan: Follow up with behavioral health clinician on : *** Behavioral recommendations: *** Referral(s): {IBH Referrals:21014055}  I discussed the assessment and treatment plan with the patient and/or parent/guardian. They were provided an opportunity to ask questions and all were answered. They agreed with the plan and demonstrated an understanding of the instructions.   They were advised to call back or seek an in-person evaluation if the symptoms worsen or if the condition fails to improve as anticipated.  Jonavon Trieu LITTIE Seats, LCSWA

## 2024-01-13 ENCOUNTER — Encounter: Payer: Self-pay | Admitting: Licensed Clinical Social Worker

## 2024-01-20 ENCOUNTER — Ambulatory Visit: Admitting: Licensed Clinical Social Worker

## 2024-01-20 DIAGNOSIS — F4323 Adjustment disorder with mixed anxiety and depressed mood: Secondary | ICD-10-CM | POA: Diagnosis not present

## 2024-01-26 NOTE — BH Specialist Note (Signed)
 "   Integrated Behavioral Health via Telemedicine Visit  01/26/2024 Jessica Vargas [id number]  Number of Integrated Behavioral Health Clinician visits: 4- Fourth Visit  Session Start time: 0915   Session End time: 1020  Total time in minutes: 65    Referring Provider: Cleatus, MD Patient/Family location: Home [facility] Provider location: Remote Work All persons participating in visit: Patient and [facility] Types of Service: Individual psychotherapy and Video visit  I connected with Jessica Vargas and/or Jessica Vargas's patient via  Telephone or Video Enabled Telemedicine Application  (Video is Caregility application) and verified that I am speaking with the correct person using two identifiers. Discussed confidentiality: Yes   I discussed the limitations of telemedicine and the availability of in person appointments.  Discussed there is a possibility of technology failure and discussed alternative modes of communication if that failure occurs.  I discussed that engaging in this telemedicine visit, they consent to the provision of behavioral healthcare and the services will be billed under their insurance.  Patient and/or legal guardian expressed understanding and consented to Telemedicine visit: Yes   Presenting Concerns: Patient and/or family reports the following symptoms/concerns: Improvements with mood and symptoms.  Duration of problem: Months; Severity of problem: moderate  Patient and/or Family's Strengths/Protective Factors: Social and Emotional competence, Concrete supports in place (healthy food, safe environments, etc.), and Physical Health (exercise, healthy diet, medication compliance, etc.)  Goals Addressed: Patient will:  Reduce symptoms of: anxiety and depression   Increase knowledge and/or ability of: coping skills, healthy habits, and self-management skills   Demonstrate ability to: Increase healthy adjustment to current life circumstances and Increase adequate support  systems for patient/family  Progress towards Goals: Ongoing    Interventions: Interventions utilized:  Mindfulness or Management Consultant, Supportive Counseling, Psychoeducation and/or Health Education, Communication Skills, and Supportive Reflection Standardized Assessments completed: Not Needed    Patient and/or Family Response: The patient was present for todays virtual session and presented with a euthymic mood. She reports feeling in a great mood over the past several weeks and denied any current concerns. The patient shared that she continues to communicate with her partner regarding financial planning, with the goal of saving money to secure a new apartment in 2026. She reports currently residing with her mother and describes the living environment as stress-free. The patient acknowledged remaining actively engaged in daily coping skills and staying busy to support ongoing mood management. Additionally, she reports continuing her search for part-time employment to increase income and is actively managing matters related to Social Security to ensure continuity of disability benefits.   Clinical Assessment/Diagnosis  Adjustment disorder with mixed anxiety and depressed mood    Assessment: The patient is currently experiencing stable mood and emotional wellbeing, reporting feeling positive and free of significant stressors. She remains motivated and engaged in coping strategies, financial planning, and vocational goals. .   Patient may benefit from continued support of behavioral health services.  Plan: Follow up with behavioral health clinician on : 1/5//2025 Behavioral recommendations: patient to continue utilizing daily coping skills and maintaining routines that support mood stability. She is also encouraged to continue therapy for ongoing support while pursuing vocational and financial goals and to seek assistance as needed when navigating Social Security or employment-related  stressors. Referral(s): Integrated [employer] (In Clinic)  I discussed the assessment and treatment plan with the patient and/or parent/guardian. They were provided an opportunity to ask questions and all were answered. They agreed with the plan and demonstrated  an understanding of the instructions.   They were advised to call back or seek an in-person evaluation if the symptoms worsen or if the condition fails to improve as anticipated.  Shamirah Ivan LITTIE Seats, LCSWA "

## 2024-02-03 ENCOUNTER — Ambulatory Visit (INDEPENDENT_AMBULATORY_CARE_PROVIDER_SITE_OTHER): Payer: Self-pay | Admitting: Licensed Clinical Social Worker

## 2024-02-03 DIAGNOSIS — F4323 Adjustment disorder with mixed anxiety and depressed mood: Secondary | ICD-10-CM

## 2024-02-04 NOTE — BH Specialist Note (Unsigned)
 "   Integrated Behavioral Health via Telemedicine Visit  02/05/2024 Jessica Vargas [id number]  Number of Integrated Behavioral Health Clinician visits: 5-Fifth Visit  Session Start time: 1015   Session End time: 1100  Total time in minutes: 45    Referring Provider: Cleatus, MD Patient/Family location: Home [facility] Provider location: Remote Office All persons participating in visit: Patient and [facility] Types of Service: Individual psychotherapy and Video visit  I connected with Prabhjot D Rodenberg and/or Waleska D Shasteen's patient via  Telephone or Video Enabled Telemedicine Application  (Video is Caregility application) and verified that I am speaking with the correct person using two identifiers. Discussed confidentiality: Yes   I discussed the limitations of telemedicine and the availability of in person appointments.  Discussed there is a possibility of technology failure and discussed alternative modes of communication if that failure occurs.  I discussed that engaging in this telemedicine visit, they consent to the provision of behavioral healthcare and the services will be billed under their insurance.  Patient and/or legal guardian expressed understanding and consented to Telemedicine visit: Yes   Presenting Concerns: Patient and/or family reports the following symptoms/concerns: Ongoing improvements in mood Duration of problem: Months; Severity of problem: moderate  Patient and/or Family's Strengths/Protective Factors: Social and Emotional competence, Concrete supports in place (healthy food, safe environments, etc.), and Physical Health (exercise, healthy diet, medication compliance, etc.)  Goals Addressed: Patient will:  Reduce symptoms of: anxiety and depression   Increase knowledge and/or ability of: coping skills and healthy habits   Demonstrate ability to: Increase healthy adjustment to current life circumstances and Increase adequate support systems for patient/family  Progress  towards Goals: Ongoing    Interventions: Interventions utilized:  Mindfulness or Management Consultant, Supportive Counseling, Psychoeducation and/or Health Education, Communication Skills, and Supportive Reflection Standardized Assessments completed: Not Needed    Patient and/or Family Response:The patient was present for todays virtual session and reported continued improvement in mood, stating she feels good and is in a positive headspace. She noted increased focus and ongoing use of coping strategies to effectively manage her mood and emotions. The patient described a recent conversation with her boyfriend in which she learned he is saving money for a future apartment, which she identified as reassuring given past concerns about his financial management. She reported deciding not to attend [employer] this year with her line sisters, acknowledging their disappointment while expressing confidence in prioritizing her personal well-being. 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 She has been actively completing job applications and has interviews scheduled, with goals of gaining employment and saving money. The patient also reported continued improvement in her relationships with her mother, sister, and boyfriend.  Clinical Assessment/Diagnosis  Adjustment disorder with mixed anxiety and depressed mood    Assessment: Patient currently experiencing a stable and positive mood, increased focus, and effective use of coping strategies to manage her emotions. She is experiencing motivation toward securing employment and housing while feeling reassured by improvements in her personal relationships..   Patient may benefit from continued support of integrated behavioral health services.  Plan: Follow up with behavioral health clinician on : 02/20/2024 Behavioral recommendations: patient is encouraged to continue utilizing her coping  strategies and maintaining focus on her employment and housing goals. Continued prioritization of self-care, healthy boundaries, and engagement in supportive relationships is recommended. Referral(s): Integrated [employer] (In Clinic)  I discussed the assessment and treatment plan with the patient and/or parent/guardian.  They were provided an opportunity to ask questions and all were answered. They agreed with the plan and demonstrated an understanding of the instructions.   They were advised to call back or seek an in-person evaluation if the symptoms worsen or if the condition fails to improve as anticipated.  Francia Verry LITTIE Seats, LCSWA "

## 2024-02-20 ENCOUNTER — Encounter: Payer: Self-pay | Admitting: Licensed Clinical Social Worker

## 2024-03-02 ENCOUNTER — Encounter: Admitting: Licensed Clinical Social Worker

## 2024-03-11 ENCOUNTER — Encounter: Admitting: Licensed Clinical Social Worker
